# Patient Record
Sex: Female | Born: 1955 | Race: Black or African American | Hispanic: No | Marital: Single | Smoking: Current every day smoker
Health system: Southern US, Community
[De-identification: ages and names within clinical notes are randomized; demographics above are authoritative.]

## PROBLEM LIST (undated history)

## (undated) DIAGNOSIS — M1990 Unspecified osteoarthritis, unspecified site: Secondary | ICD-10-CM

## (undated) DIAGNOSIS — K219 Gastro-esophageal reflux disease without esophagitis: Secondary | ICD-10-CM

## (undated) DIAGNOSIS — F419 Anxiety disorder, unspecified: Secondary | ICD-10-CM

## (undated) DIAGNOSIS — G4730 Sleep apnea, unspecified: Secondary | ICD-10-CM

## (undated) DIAGNOSIS — F329 Major depressive disorder, single episode, unspecified: Secondary | ICD-10-CM

## (undated) DIAGNOSIS — E785 Hyperlipidemia, unspecified: Secondary | ICD-10-CM

## (undated) DIAGNOSIS — I10 Essential (primary) hypertension: Secondary | ICD-10-CM

## (undated) DIAGNOSIS — J45909 Unspecified asthma, uncomplicated: Secondary | ICD-10-CM

## (undated) DIAGNOSIS — F32A Depression, unspecified: Secondary | ICD-10-CM

## (undated) HISTORY — DX: Unspecified asthma, uncomplicated: J45.909

## (undated) HISTORY — DX: Depression, unspecified: F32.A

## (undated) HISTORY — DX: Sleep apnea, unspecified: G47.30

## (undated) HISTORY — PX: LEG SURGERY: SHX1003

## (undated) HISTORY — DX: Hyperlipidemia, unspecified: E78.5

## (undated) HISTORY — DX: Gastro-esophageal reflux disease without esophagitis: K21.9

## (undated) HISTORY — DX: Anxiety disorder, unspecified: F41.9

## (undated) HISTORY — DX: Unspecified osteoarthritis, unspecified site: M19.90

---
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
## 1998-10-30 ENCOUNTER — Emergency Department (HOSPITAL_COMMUNITY): Admission: EM | Admit: 1998-10-30 | Discharge: 1998-10-30 | Payer: Self-pay | Admitting: Emergency Medicine

## 1998-11-05 ENCOUNTER — Encounter: Payer: Self-pay | Admitting: Family Medicine

## 1998-11-05 ENCOUNTER — Ambulatory Visit (HOSPITAL_COMMUNITY): Admission: RE | Admit: 1998-11-05 | Discharge: 1998-11-05 | Payer: Self-pay | Admitting: Family Medicine

## 2000-05-22 ENCOUNTER — Encounter: Payer: Self-pay | Admitting: Emergency Medicine

## 2000-05-22 ENCOUNTER — Inpatient Hospital Stay (HOSPITAL_COMMUNITY): Admission: EM | Admit: 2000-05-22 | Discharge: 2000-05-23 | Payer: Self-pay | Admitting: Emergency Medicine

## 2001-07-25 ENCOUNTER — Encounter: Payer: Self-pay | Admitting: Family Medicine

## 2001-07-25 ENCOUNTER — Encounter: Admission: RE | Admit: 2001-07-25 | Discharge: 2001-07-25 | Payer: Self-pay | Admitting: Family Medicine

## 2001-09-18 ENCOUNTER — Encounter: Payer: Self-pay | Admitting: *Deleted

## 2001-09-18 ENCOUNTER — Encounter: Payer: Self-pay | Admitting: Orthopedic Surgery

## 2001-09-18 ENCOUNTER — Inpatient Hospital Stay (HOSPITAL_COMMUNITY): Admission: EM | Admit: 2001-09-18 | Discharge: 2001-09-20 | Payer: Self-pay | Admitting: *Deleted

## 2001-10-15 ENCOUNTER — Encounter: Admission: RE | Admit: 2001-10-15 | Discharge: 2002-01-13 | Payer: Self-pay | Admitting: Orthopedic Surgery

## 2012-10-11 ENCOUNTER — Encounter (HOSPITAL_COMMUNITY): Payer: Self-pay | Admitting: Emergency Medicine

## 2012-10-11 ENCOUNTER — Emergency Department (INDEPENDENT_AMBULATORY_CARE_PROVIDER_SITE_OTHER)
Admission: EM | Admit: 2012-10-11 | Discharge: 2012-10-11 | Disposition: A | Discharge status: Home / Self Care | Payer: 59 | Source: Home / Self Care | Attending: Emergency Medicine | Admitting: Emergency Medicine

## 2012-10-11 LAB — CBC WITH DIFFERENTIAL/PLATELET
Basophils Absolute: 0 10*3/uL (ref 0.0–0.1)
Basophils Relative: 0 % (ref 0–1)
Eosinophils Absolute: 0.1 10*3/uL (ref 0.0–0.7)
Eosinophils Relative: 1 % (ref 0–5)
Lymphs Abs: 3 10*3/uL (ref 0.7–4.0)
MCH: 34 pg (ref 26.0–34.0)
MCHC: 34.9 g/dL (ref 30.0–36.0)
MCV: 97.3 fL (ref 78.0–100.0)
Neutrophils Relative %: 54 % (ref 43–77)
Platelets: 226 10*3/uL (ref 150–400)
RBC: 4.06 MIL/uL (ref 3.87–5.11)
RDW: 13.6 % (ref 11.5–15.5)

## 2012-10-11 LAB — SEDIMENTATION RATE: Sed Rate: 38 mm/hr — ABNORMAL HIGH (ref 0–22)
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
## 2012-10-11 NOTE — ED Provider Notes (Signed)
Medical screening examination/treatment/procedure(s) were performed by non-physician practitioner and as supervising physician I was immediately available for consultation/collaboration.  David Keller, M.D.  David C Keller, MD 10/11/12 2335 
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
## 2012-10-14 NOTE — ED Notes (Signed)
Sed rate 38 H.  Lab shown to Rica Mast NP.  She said pt. was referred to Dr. Merlyn Lot and he had requested it. No further action. Vassie Moselle 10/14/2012

## 2015-05-31 ENCOUNTER — Encounter (HOSPITAL_COMMUNITY): Payer: Self-pay | Admitting: Emergency Medicine

## 2015-05-31 ENCOUNTER — Emergency Department (HOSPITAL_COMMUNITY): Payer: Worker's Compensation

## 2015-05-31 ENCOUNTER — Emergency Department (HOSPITAL_COMMUNITY)
Admission: EM | Admit: 2015-05-31 | Discharge: 2015-05-31 | Disposition: A | Discharge status: Home / Self Care | Payer: Worker's Compensation | Attending: Emergency Medicine | Admitting: Emergency Medicine

## 2015-05-31 DIAGNOSIS — S81812A Laceration without foreign body, left lower leg, initial encounter: Secondary | ICD-10-CM | POA: Diagnosis not present

## 2015-05-31 DIAGNOSIS — Y9289 Other specified places as the place of occurrence of the external cause: Secondary | ICD-10-CM | POA: Diagnosis not present

## 2015-05-31 DIAGNOSIS — W25XXXA Contact with sharp glass, initial encounter: Secondary | ICD-10-CM | POA: Insufficient documentation

## 2015-05-31 DIAGNOSIS — Z720 Tobacco use: Secondary | ICD-10-CM | POA: Insufficient documentation

## 2015-05-31 DIAGNOSIS — Y998 Other external cause status: Secondary | ICD-10-CM | POA: Diagnosis not present

## 2015-05-31 DIAGNOSIS — S8992XA Unspecified injury of left lower leg, initial encounter: Secondary | ICD-10-CM | POA: Diagnosis present

## 2015-05-31 DIAGNOSIS — Y9389 Activity, other specified: Secondary | ICD-10-CM | POA: Insufficient documentation

## 2015-05-31 IMAGING — CR DG TIBIA/FIBULA 2V*L*
1 series · 2 of 2 positions shown · non-contrast
Comparison: None.

CLINICAL DATA: Laceration to the left mid tibia medially. Clinical
concern for glass foreign body.

EXAM:
LEFT TIBIA AND FIBULA - 2 VIEW

[Series 1: AP · left · 2 of 2 slices shown]
[im 1/2]
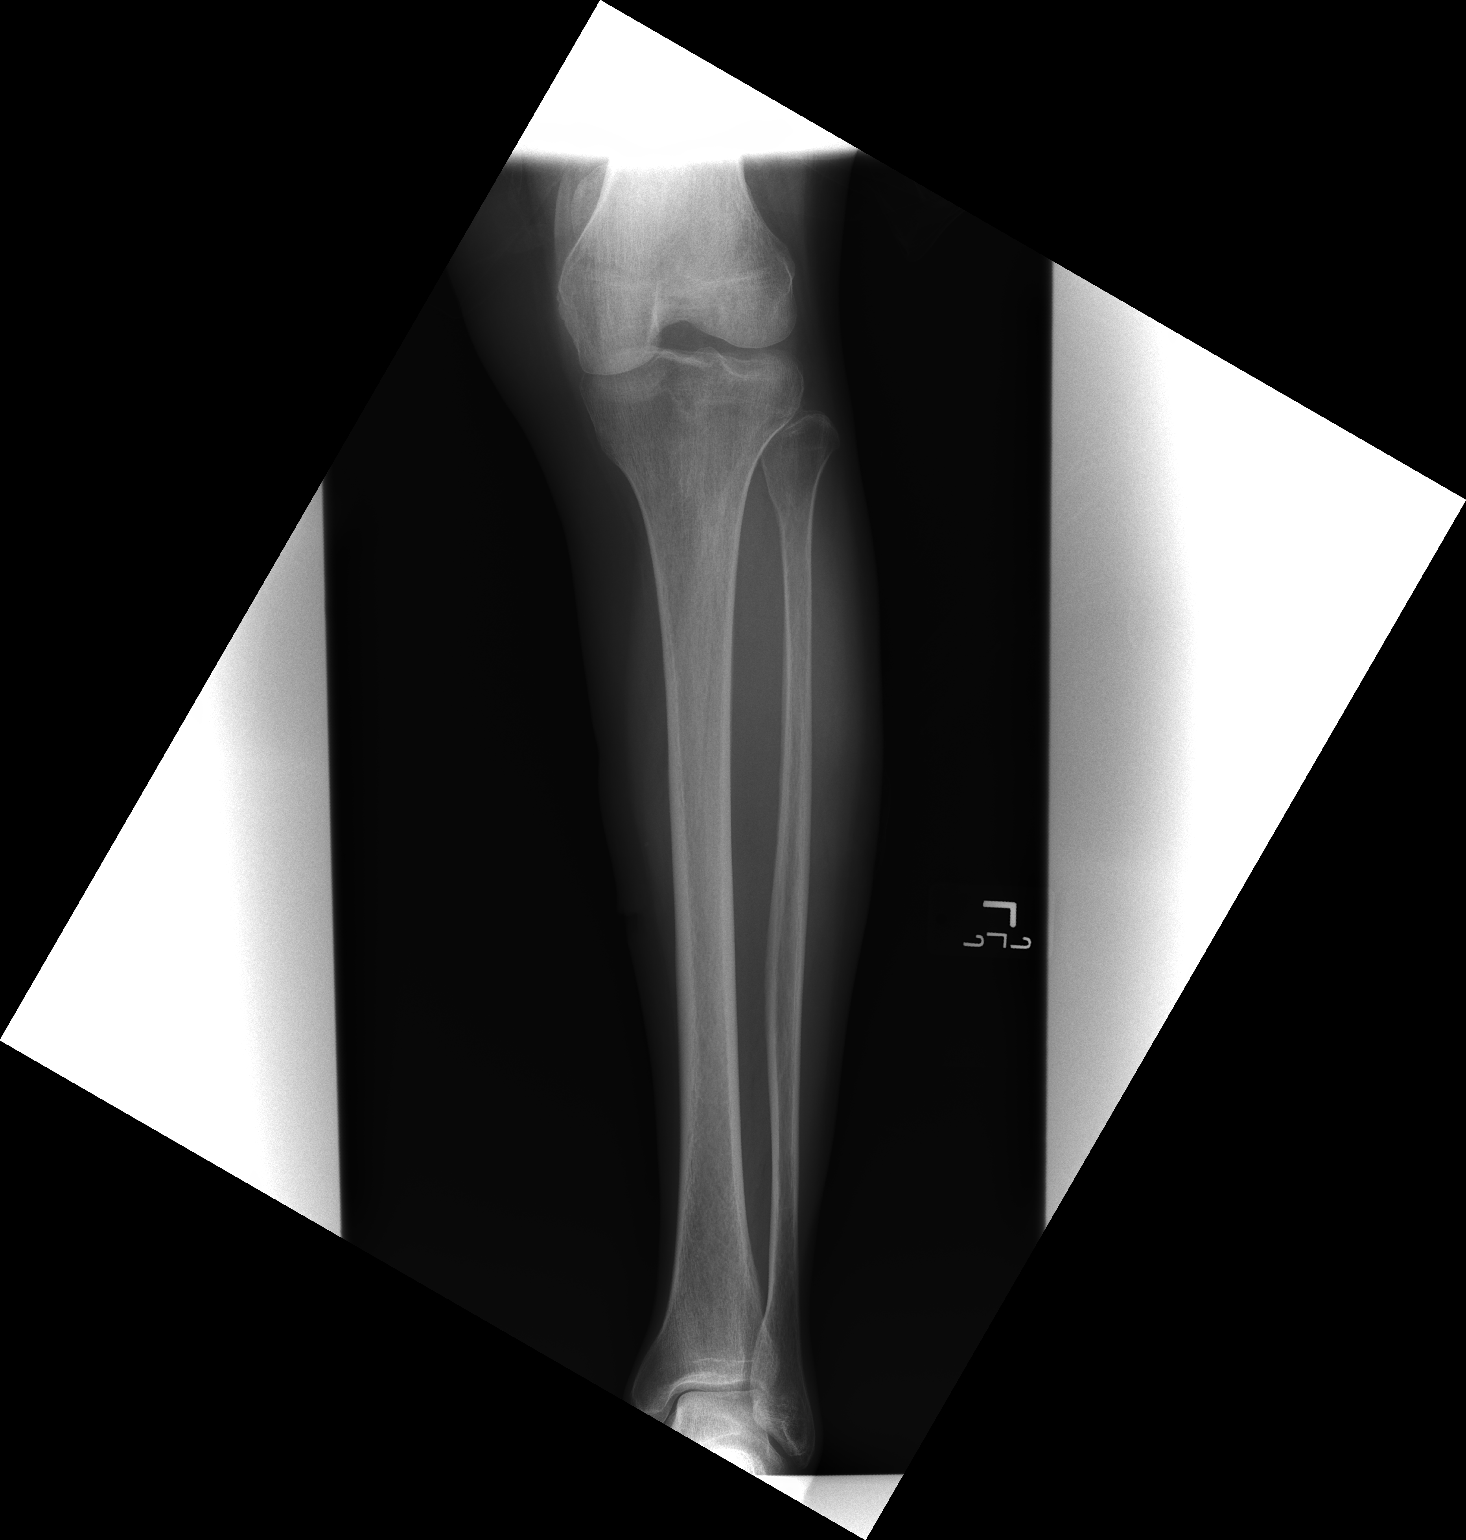
[im 2/2]
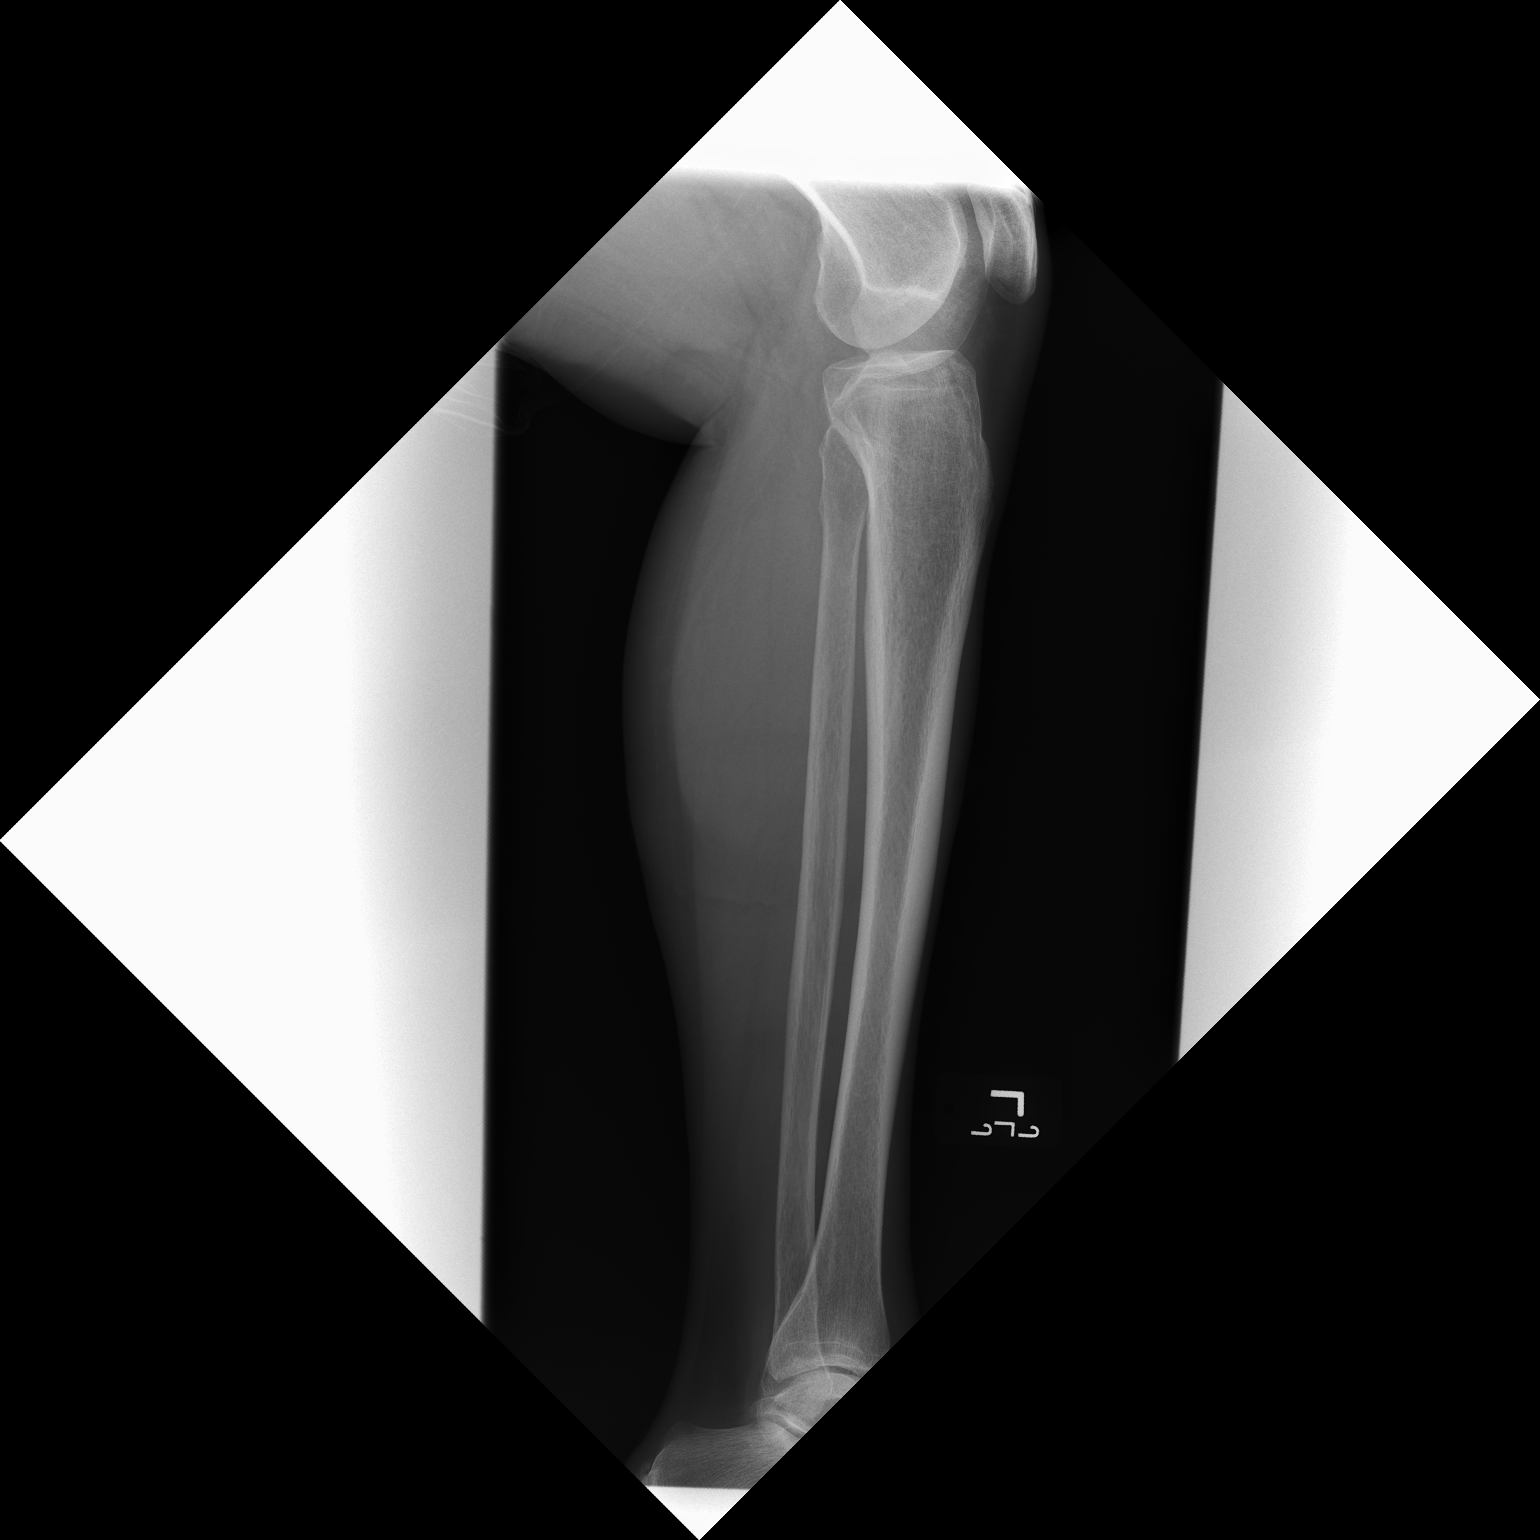

[2 of 2 positions shown; findings below may reference images not displayed]

FINDINGS: No fracture or suspicious focal osseous lesion. Soft tissues
laceration is noted in the medial left lower extremity soft tissues
at the level of the mid tibial shaft. No radiopaque foreign body is
seen immediately adjacent to the soft tissue laceration.
Approximately 2.7 cm superior to the soft tissue laceration, there
is a nonspecific 2 mm round soft tissue density.
IMPRESSION: Soft tissue laceration in the medial left lower extremity soft
tissues at the level of the mid tibial shaft. No radiopaque foreign
body immediately adjacent to the soft tissue laceration. Nonspecific
2 mm round soft tissue density is seen approximately 2.7 cm superior
to the soft tissue laceration, which likely represents a calcified
venous phlebolith, less likely a radiopaque foreign body.

## 2015-05-31 MED ORDER — BACITRACIN ZINC 500 UNIT/GM EX OINT
TOPICAL_OINTMENT | CUTANEOUS | Status: AC
  Filled 2015-05-31: qty 0.9

## 2015-05-31 MED ORDER — ACETAMINOPHEN 500 MG PO TABS
1000.0000 mg | ORAL_TABLET | Freq: Once | ORAL | Status: AC
  Administered 2015-05-31: 1000 mg via ORAL
  Filled 2015-05-31: qty 2

## 2015-05-31 MED ORDER — LIDOCAINE-EPINEPHRINE (PF) 2 %-1:200000 IJ SOLN
20.0000 mL | Freq: Once | INTRAMUSCULAR | Status: DC
  Filled 2015-05-31: qty 20
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
## 2015-05-31 NOTE — ED Notes (Signed)
Bed: WA17 Expected date:  Expected time:  Means of arrival:  Comments: EMS 62f lac to leg

## 2015-05-31 NOTE — ED Notes (Signed)
Laceration wrapped with 4x4 gauze for pt. To use restroom.

## 2015-05-31 NOTE — ED Notes (Signed)
Pt c/o laceration of left calf area. Pt denies any lightheadedness 0 pain, dizziness, and  Nausea.  Was freighted by all the blood loss. States her leg does sting though.

## 2015-05-31 NOTE — ED Notes (Signed)
Lidocaine and cart waiting for provider

## 2016-09-04 ENCOUNTER — Other Ambulatory Visit: Payer: Self-pay | Admitting: Internal Medicine

## 2016-09-04 DIAGNOSIS — E2839 Other primary ovarian failure: Secondary | ICD-10-CM

## 2017-04-06 ENCOUNTER — Encounter (HOSPITAL_COMMUNITY): Payer: Self-pay | Admitting: *Deleted

## 2017-04-06 ENCOUNTER — Emergency Department (HOSPITAL_COMMUNITY)
Admission: EM | Admit: 2017-04-06 | Discharge: 2017-04-06 | Disposition: A | Discharge status: Home / Self Care | Payer: Medicaid Other | Attending: Emergency Medicine | Admitting: Emergency Medicine

## 2017-04-06 DIAGNOSIS — F17210 Nicotine dependence, cigarettes, uncomplicated: Secondary | ICD-10-CM | POA: Diagnosis not present

## 2017-04-06 DIAGNOSIS — I10 Essential (primary) hypertension: Secondary | ICD-10-CM | POA: Diagnosis not present

## 2017-04-06 DIAGNOSIS — K047 Periapical abscess without sinus: Secondary | ICD-10-CM | POA: Diagnosis not present

## 2017-04-06 DIAGNOSIS — K0889 Other specified disorders of teeth and supporting structures: Secondary | ICD-10-CM | POA: Diagnosis present

## 2017-04-06 HISTORY — DX: Essential (primary) hypertension: I10

## 2017-04-06 MED ORDER — LIDOCAINE VISCOUS 2 % MT SOLN
15.0000 mL | Freq: Once | OROMUCOSAL | Status: AC
  Administered 2017-04-06: 15 mL via OROMUCOSAL
  Filled 2017-04-06: qty 15

## 2017-04-06 MED ORDER — BUPIVACAINE-EPINEPHRINE (PF) 0.5% -1:200000 IJ SOLN
1.8000 mL | Freq: Once | INTRAMUSCULAR | Status: AC
  Administered 2017-04-06: 1.8 mL
  Filled 2017-04-06: qty 1.8

## 2017-04-06 MED ORDER — AMOXICILLIN-POT CLAVULANATE 875-125 MG PO TABS: 1.0000 | ORAL_TABLET | Freq: Two times a day (BID) | ORAL | 0 refills | Status: AC

## 2017-04-06 MED ORDER — AMOXICILLIN-POT CLAVULANATE 875-125 MG PO TABS
1.0000 | ORAL_TABLET | Freq: Once | ORAL | Status: AC
  Administered 2017-04-06: 1 via ORAL
  Filled 2017-04-06: qty 1

## 2017-04-06 NOTE — Care Management (Signed)
ED CM was consulted by Gwenith Spitz in [facility] concerning medication assistance need. CM reviewed record, no health insurance listed. Pt is eligible for MATCH. Discussed MATCH program and the guidelines including the  $3 co-pay per prescription. Pt verbalized understanding and is agreeable with accepting the assistance.  Pt enrolled and [place] letter printed and given to patient with list of participating pharmacies.  Patient was instructed to present  prescriptions with [place] letter to a participating Pharmacies from the list provided. Patient verbalizes  understanding and appreciation. Reminded patient of the importance of following up with PCP. Pt verbalized understanding teach back done. No further ED CM needs identified.

## 2017-04-06 NOTE — Discharge Instructions (Signed)
Please take Augmentin once every 12 hours for the next 5 days. Taking this medication with food can help decrease the chances of getting an upset stomach.  Please call your dentist's office on Monday morning to schedule a follow-up appointment.  Please swish and spit with warm salt water 2-3 times a day over the next few days..  If he develop new or worsening symptoms, including thick drainage from the area that looks like pus, fever, or chills, please return to the emergency department for reevaluation.

## 2017-04-06 NOTE — ED Provider Notes (Signed)
[facility] DEPT Provider Note   CSN: [id number] Arrival date & time: 04/06/17  1220     History   Chief Complaint No chief complaint on file.   HPI Hannah Moon is a 61 y.o. female who presents to the emergency department with a chief complaint of left lower mouth pain and purulent discharge that began this morning. No fever or chills. No treatment prior to arrival. She denies trismus, drooling, neck pain, or dental pain. She reports that she is established with a dentist, but his office was closed today.   The history is provided by the patient. No language interpreter was used.    Past Medical History:  Diagnosis Date  . Hypertension     There are no active problems to display for this patient.   Past Surgical History:  Procedure Laterality Date  . LEG SURGERY      OB History    No data available       Home Medications    Prior to Admission medications   Medication Sig Start Date End Date Taking? Authorizing Provider  amoxicillin-clavulanate (AUGMENTIN) 875-125 MG tablet Take 1 tablet by mouth every 12 (twelve) hours. 04/06/17 04/11/17  Roverto Bodmer, Maree Erie A, PA-C    Family History No family history on file.  Social History Social History  Substance Use Topics  . Smoking status: Current Every Day Smoker    Packs/day: 0.50    Years: 40.00    Types: Cigarettes  . Smokeless tobacco: Never Used  . Alcohol use 0.6 oz/week    1 Cans of beer per week     Allergies   Patient has no known allergies.   Review of Systems Review of Systems  Constitutional: Negative for chills and fever.  HENT: Positive for dental problem. Negative for trouble swallowing and voice change.   Skin: Positive for wound.     Physical Exam Updated Vital Signs BP (!) 140/95 (BP Location: Left Arm)   Pulse 79   Temp 98.7 F (37.1 C) (Oral)   Resp 17   SpO2 97%   Physical Exam  Constitutional: No distress.  HENT:  Head: Normocephalic.  There is a 0.5 mm fluctuant abscess  to the left anterolateral mastication space. No surrounding cellulitis.   Eyes: Conjunctivae are normal.  Neck: Normal range of motion. Neck supple.  The neck is nontender to palpation.  Cardiovascular: Normal rate and regular rhythm.  Exam reveals no gallop and no friction rub.   No murmur heard. Pulmonary/Chest: Effort normal. No respiratory distress.  Abdominal: Soft. She exhibits no distension.  Neurological: She is alert.  Skin: Skin is warm. No rash noted.  Psychiatric: Her behavior is normal.  Nursing note and vitals reviewed.  ED Treatments / Results  Labs (all labs ordered are listed, but only abnormal results are displayed) Labs Reviewed - No data to display  EKG  EKG Interpretation None       Radiology No results found.  Procedures .Marland KitchenIncision and Drainage Date/Time: 04/06/2017 4:37 PM Performed by: Sherryle Lis, [place] Masaki A Authorized by: Joline Maxcy A   Consent:    Consent obtained:  Verbal   Consent given by:  Patient   Risks discussed:  Bleeding, incomplete drainage, pain and infection   Alternatives discussed:  No treatment Location:    Type:  Abscess   Size:  0.5 cm   Location:  Mouth   Mouth location:  Masticator space Anesthesia (see MAR for exact dosages):    Anesthesia method:  Local infiltration and topical  application   Topical anesthetic:  Lidocaine gel   Local anesthetic:  Bupivacaine 0.5% WITH epi Procedure details:    Incision types:  Single straight   Incision depth:  Dermal   Scalpel blade:  10   Wound management:  Irrigated with saline   Drainage:  Bloody and purulent   Drainage amount:  Moderate   Wound treatment:  Wound left open   Packing materials:  None Post-procedure details:    Patient tolerance of procedure:  Tolerated well, no immediate complications   (including critical care time)  Medications Ordered in ED Medications  amoxicillin-clavulanate (AUGMENTIN) 875-125 MG per tablet 1 tablet (not administered)  lidocaine  (XYLOCAINE) 2 % viscous mouth solution 15 mL (15 mLs Mouth/Throat Given 04/06/17 1621)  bupivacaine-epinephrine (MARCAINE W/ EPI) 0.5% -1:200000 injection 1.8 mL (1.8 mLs Infiltration Given by Other 04/06/17 1623)     Initial Impression / Assessment and Plan / ED Course  I have reviewed the triage vital signs and the nursing notes.  Pertinent labs & imaging results that were available during my care of the patient were reviewed by me and considered in my medical decision making (see chart for details).     Patient with dental abscess amenable to incision and drainage.  Abscess was not large enough to warrant packing or drain,  wound recheck in 3 days with dentist. Encouraged home warm soaks and flushing. No evidence of surrounding cellulitis.  Strict return precautions given. Will d/c to home with Augmentin.   Final Clinical Impressions(s) / ED Diagnoses   Final diagnoses:  Dental abscess    New Prescriptions New Prescriptions   AMOXICILLIN-CLAVULANATE (AUGMENTIN) 875-125 MG TABLET    Take 1 tablet by mouth every 12 (twelve) hours.     Joanne Gavel, PA-C 04/06/17 1645    Dorie Rank, MD 04/06/17 [phone]

## 2017-04-06 NOTE — ED Notes (Signed)
Spoke with case management.

## 2017-04-06 NOTE — ED Triage Notes (Signed)
Pt in c/o L lower gum swelling and discharge onset this am, pt A&O x4

## 2017-04-19 ENCOUNTER — Emergency Department (HOSPITAL_COMMUNITY)
Admission: EM | Admit: 2017-04-19 | Discharge: 2017-04-19 | Disposition: A | Discharge status: Home / Self Care | Payer: Medicaid Other | Attending: Emergency Medicine | Admitting: Emergency Medicine

## 2017-04-19 DIAGNOSIS — F17210 Nicotine dependence, cigarettes, uncomplicated: Secondary | ICD-10-CM | POA: Diagnosis not present

## 2017-04-19 DIAGNOSIS — Y999 Unspecified external cause status: Secondary | ICD-10-CM | POA: Diagnosis not present

## 2017-04-19 DIAGNOSIS — Y929 Unspecified place or not applicable: Secondary | ICD-10-CM | POA: Insufficient documentation

## 2017-04-19 DIAGNOSIS — M79604 Pain in right leg: Secondary | ICD-10-CM | POA: Diagnosis present

## 2017-04-19 DIAGNOSIS — Y939 Activity, unspecified: Secondary | ICD-10-CM | POA: Diagnosis not present

## 2017-04-19 DIAGNOSIS — W57XXXA Bitten or stung by nonvenomous insect and other nonvenomous arthropods, initial encounter: Secondary | ICD-10-CM | POA: Diagnosis not present

## 2017-04-19 DIAGNOSIS — I10 Essential (primary) hypertension: Secondary | ICD-10-CM | POA: Insufficient documentation

## 2017-04-19 DIAGNOSIS — T148XXA Other injury of unspecified body region, initial encounter: Secondary | ICD-10-CM | POA: Diagnosis not present

## 2017-04-19 MED ORDER — DOXYCYCLINE HYCLATE 100 MG PO CAPS: 100.0000 mg | ORAL_CAPSULE | Freq: Two times a day (BID) | ORAL | 0 refills | Status: DC

## 2017-04-19 NOTE — ED Triage Notes (Signed)
Pt has a rob in her right lower leg and noticed a knot that has came up along her right shin that is sometimes mildly painful with walking, no pain at this this, no redness or swelling noted other than  A Small knot of right shin.

## 2017-04-19 NOTE — ED Provider Notes (Signed)
[facility] DEPT Provider Note   CSN: [id number] Arrival date & time: 04/19/17  0847     History   Chief Complaint Chief Complaint  Patient presents with  . Leg Pain    HPI Hannah Moon is a 61 y.o. female who presents emergency Department with chief complaint of right leg pain. Patient states that she got a mosquito bite on the outside of her leg and began scratching it. The next day she noticed swelling that was painful and tender to palpation on the right outer part of her leg. She has a history of right tib-fib ORIF and was concerned that her swelling might be secondary to her previous surgery 3 or 4 years ago. She denies any global swelling, calf pain, chest pain or shortness of breath. Her swelling is localized to the area under her mosquito bite. She denies any fevers, chills or or other symptoms suggestive of systemic infection. She rates her pain at 2 out of 10. It does not radiate.  HPI  Past Medical History:  Diagnosis Date  . Hypertension     There are no active problems to display for this patient.   Past Surgical History:  Procedure Laterality Date  . LEG SURGERY      OB History    No data available       Home Medications    Prior to Admission medications   Medication Sig Start Date End Date Taking? Authorizing Provider  doxycycline (VIBRAMYCIN) 100 MG capsule Take 1 capsule (100 mg total) by mouth 2 (two) times daily. 04/19/17   Margarita Mail, PA-C    Family History No family history on file.  Social History Social History  Substance Use Topics  . Smoking status: Current Every Day Smoker    Packs/day: 0.50    Years: 40.00    Types: Cigarettes  . Smokeless tobacco: Never Used  . Alcohol use 0.6 oz/week    1 Cans of beer per week     Allergies   Patient has no known allergies.   Review of Systems Review of Systems  Ten systems reviewed and are negative for acute change, except as noted in the HPI.   Physical Exam Updated Vital  Signs BP 138/83 (BP Location: Right Arm)   Pulse 78   Temp 98 F (36.7 C) (Oral)   Resp 16   Ht 5\' 1"  (1.549 m)   Wt 71.2 kg (157 lb)   SpO2 99%   BMI 29.66 kg/m   Physical Exam  Constitutional: She is oriented to person, place, and time. She appears well-developed and well-nourished. No distress.  HENT:  Head: Normocephalic and atraumatic.  Eyes: Conjunctivae are normal. No scleral icterus.  Neck: Normal range of motion.  Cardiovascular: Normal rate, regular rhythm and normal heart sounds.  Exam reveals no gallop and no friction rub.   No murmur heard. Pulmonary/Chest: Effort normal and breath sounds normal. No respiratory distress.  Abdominal: Soft. Bowel sounds are normal. She exhibits no distension and no mass. There is no tenderness. There is no guarding.  Musculoskeletal:  6 cm area of tenderness and swelling over the right lateral lower leg. There is central duration and excoriation. Tender to palpation without warmth, erythema. It is well circumscribed, mild bruising underneath the tissue.  Neurological: She is alert and oriented to person, place, and time.  Skin: Skin is warm and dry. She is not diaphoretic.  Psychiatric: Her behavior is normal.  Nursing note and vitals reviewed.    ED Treatments /  Results  Labs (all labs ordered are listed, but only abnormal results are displayed) Labs Reviewed - No data to display  EKG  EKG Interpretation None       Radiology No results found.  Procedures Procedures (including critical care time)  Medications Ordered in ED Medications - No data to display   Initial Impression / Assessment and Plan / ED Course  I have reviewed the triage vital signs and the nursing notes.  Pertinent labs & imaging results that were available during my care of the patient were reviewed by me and considered in my medical decision making (see chart for details).    Patient with what appears to be hematoma of the right lower leg. I  believe is secondary to the excoriation of the tissue. She is not on any blood thinners. There is no evidence of DVT, no generalized swelling. Differential includes developing abscess. The patient has recently completed a course of antibiotics for a dental abscess. I am giving her a prescription for doxycycline to hold in case of developing infection. We have discussed signs and symptoms. Patient to follow up with PCP. Discussed return precautions. She appears safe for discharge at this time  Final Clinical Impressions(s) / ED Diagnoses   Final diagnoses:  Hematoma  Insect bite, initial encounter    New Prescriptions New Prescriptions   DOXYCYCLINE (VIBRAMYCIN) 100 MG CAPSULE    Take 1 capsule (100 mg total) by mouth 2 (two) times daily.     Margarita Mail, PA-C 04/19/17 [place], MD 04/19/17 1728

## 2017-04-19 NOTE — ED Notes (Signed)
Pt c/o pain and swelling to right lower leg. States has hx of open tib/fib and "that was where the bone came out".

## 2017-04-19 NOTE — Discharge Instructions (Signed)
Follow these instructions at home: Apply ice to the injured area: Put ice in a plastic bag. Place a towel between your skin and the bag. Leave the ice on for 20 minutes, 2-3 times a day for the first 1 to 2 days. After the first 2 days, switch to using warm compresses on the hematoma. Elevate the injured area to help decrease pain and swelling. Wrapping the area with an elastic bandage may also be helpful. Compression helps to reduce swelling and promotes shrinking of the hematoma. Make sure the bandage is not wrapped too tight. If your hematoma is on a lower extremity and is painful, crutches may be helpful for a couple days. Only take over-the-counter or prescription medicines as directed by your health care provider. Get help right away if: You have increasing pain, or your pain is not controlled with medicine. You have a fever. You have worsening swelling or discoloration. Your skin over the hematoma breaks or starts bleeding. Your hematoma is in your chest or abdomen and you have weakness, shortness of breath, or a change in consciousness. Your hematoma is on your scalp (caused by a fall or injury) and you have a worsening headache or a change in alertness or consciousness.

## 2018-03-03 ENCOUNTER — Other Ambulatory Visit: Payer: Self-pay

## 2018-03-03 ENCOUNTER — Emergency Department (HOSPITAL_COMMUNITY): Payer: Self-pay

## 2018-03-03 ENCOUNTER — Emergency Department (HOSPITAL_COMMUNITY)
Admission: EM | Admit: 2018-03-03 | Discharge: 2018-03-03 | Disposition: A | Discharge status: Home / Self Care | Payer: Self-pay | Attending: Emergency Medicine | Admitting: Emergency Medicine

## 2018-03-03 DIAGNOSIS — F17210 Nicotine dependence, cigarettes, uncomplicated: Secondary | ICD-10-CM | POA: Insufficient documentation

## 2018-03-03 DIAGNOSIS — S8012XA Contusion of left lower leg, initial encounter: Secondary | ICD-10-CM | POA: Insufficient documentation

## 2018-03-03 DIAGNOSIS — S9032XA Contusion of left foot, initial encounter: Secondary | ICD-10-CM | POA: Insufficient documentation

## 2018-03-03 DIAGNOSIS — W182XXA Fall in (into) shower or empty bathtub, initial encounter: Secondary | ICD-10-CM | POA: Insufficient documentation

## 2018-03-03 DIAGNOSIS — S93402A Sprain of unspecified ligament of left ankle, initial encounter: Secondary | ICD-10-CM | POA: Insufficient documentation

## 2018-03-03 DIAGNOSIS — Y999 Unspecified external cause status: Secondary | ICD-10-CM | POA: Insufficient documentation

## 2018-03-03 DIAGNOSIS — I10 Essential (primary) hypertension: Secondary | ICD-10-CM | POA: Insufficient documentation

## 2018-03-03 DIAGNOSIS — Y92002 Bathroom of unspecified non-institutional (private) residence single-family (private) house as the place of occurrence of the external cause: Secondary | ICD-10-CM | POA: Insufficient documentation

## 2018-03-03 DIAGNOSIS — Y93E1 Activity, personal bathing and showering: Secondary | ICD-10-CM | POA: Insufficient documentation

## 2018-03-03 IMAGING — DX DG FOOT COMPLETE 3+V*L*
3 series · 3 of 3 positions shown · non-contrast
Comparison: None.

CLINICAL DATA: Lateral ankle pain and swelling after falling from
bathtub yesterday. Initial encounter.

EXAM:
LEFT FOOT - COMPLETE 3+ VIEW

[x foot lat left]
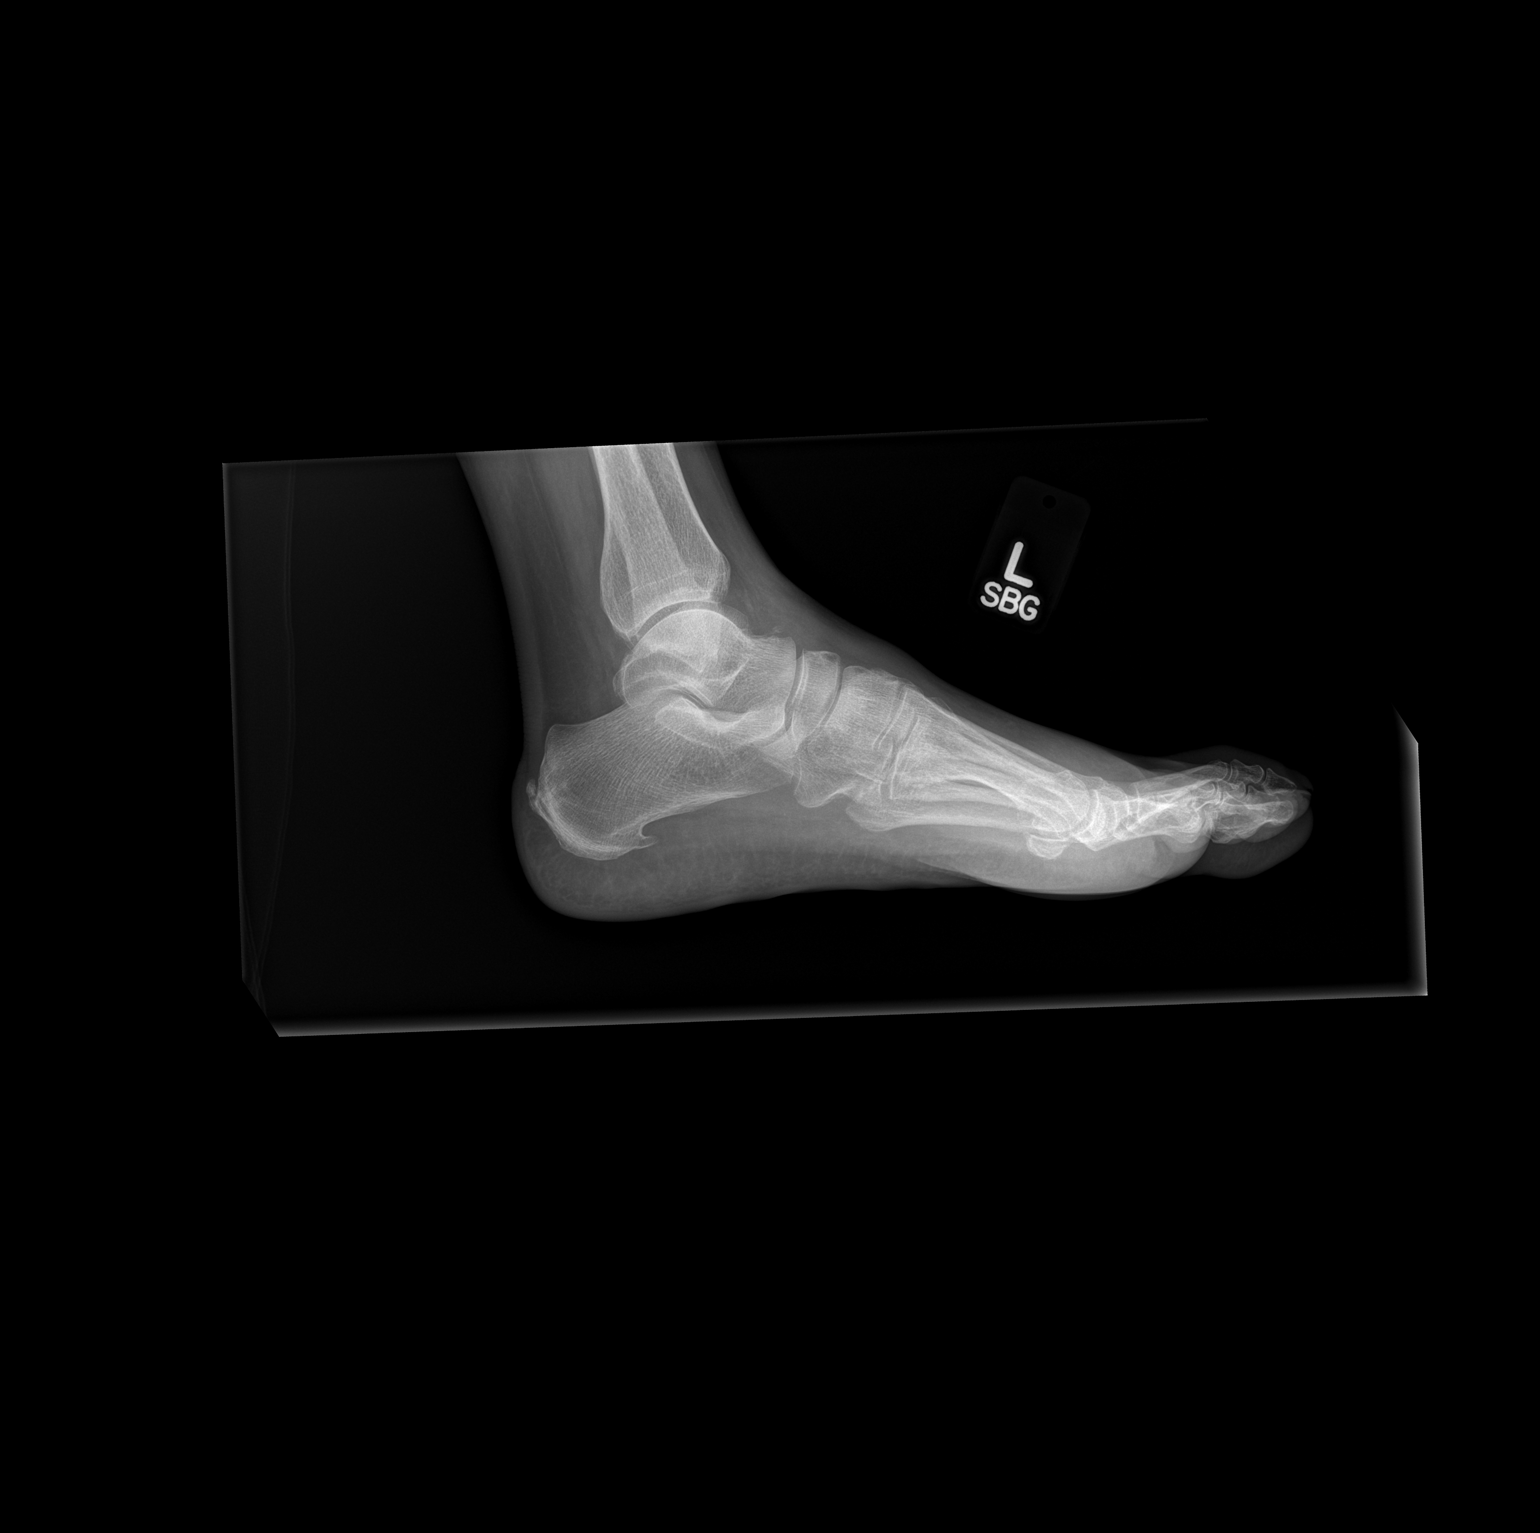

[x foot ap left]
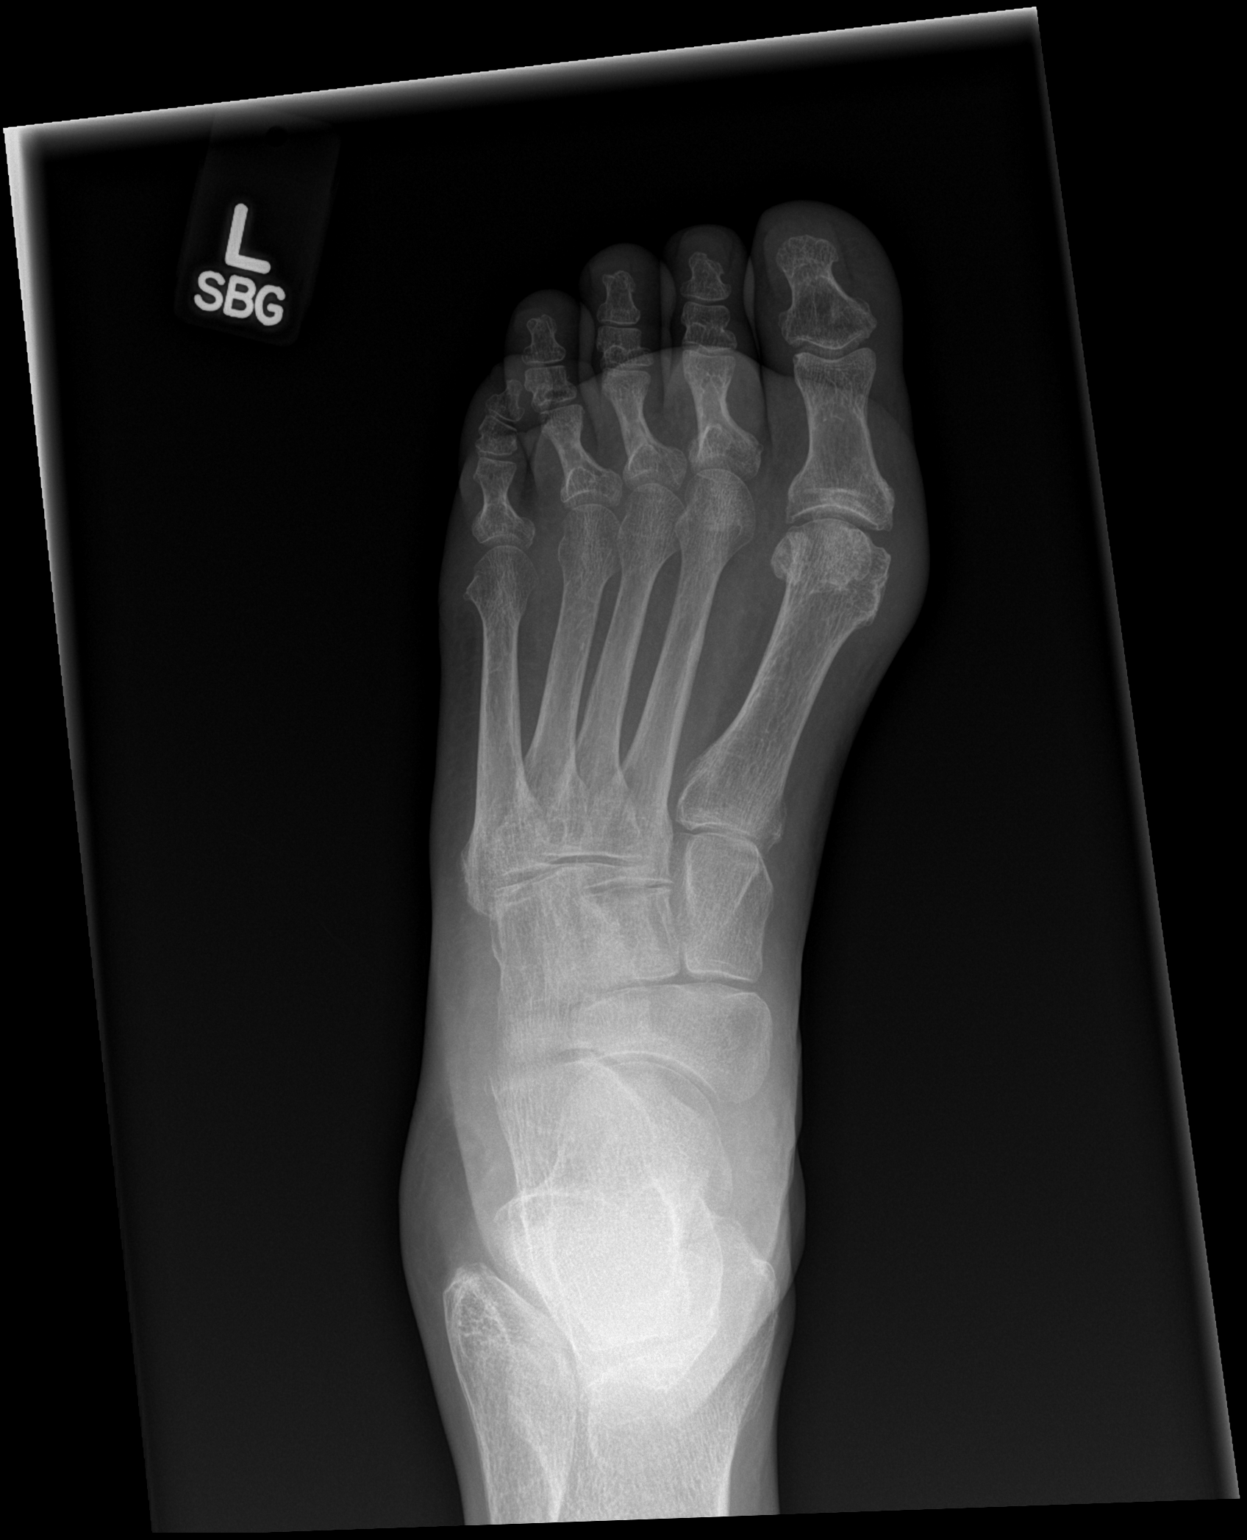

[x foot obl left]
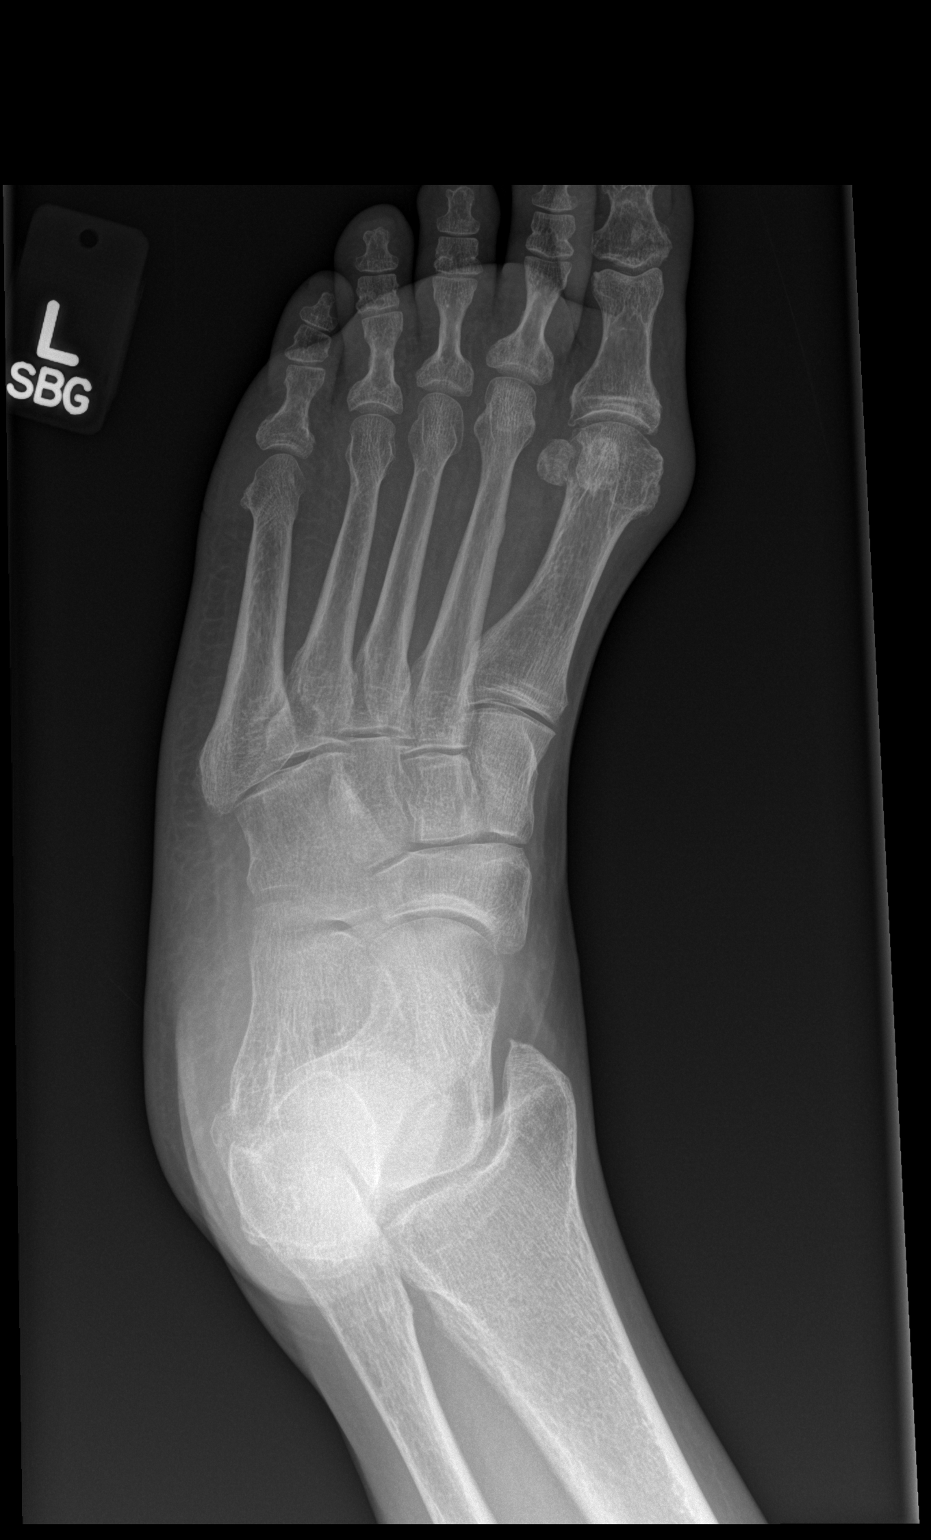

[3 of 3 positions shown; findings below may reference images not displayed]

FINDINGS: The bones appear demineralized. There is a possible small avulsion
fracture laterally from the anterior process of the calcaneus with
adjacent soft tissue swelling. No other evidence of acute fracture
or dislocation in the foot. Mild degenerative changes are present at
the 1st metatarsophalangeal joint.
IMPRESSION: Possible small avulsion fracture laterally from the anterior process
of the calcaneus.

## 2018-03-03 IMAGING — DX DG KNEE COMPLETE 4+V*L*
4 series · 4 of 4 positions shown · non-contrast
Comparison: Lower leg radiographs 05/31/2015.

CLINICAL DATA: Fall from bathtub yesterday. Pain from left hip to
left foot.

EXAM:
LEFT KNEE - COMPLETE 4+ VIEW

[x knee ap left]
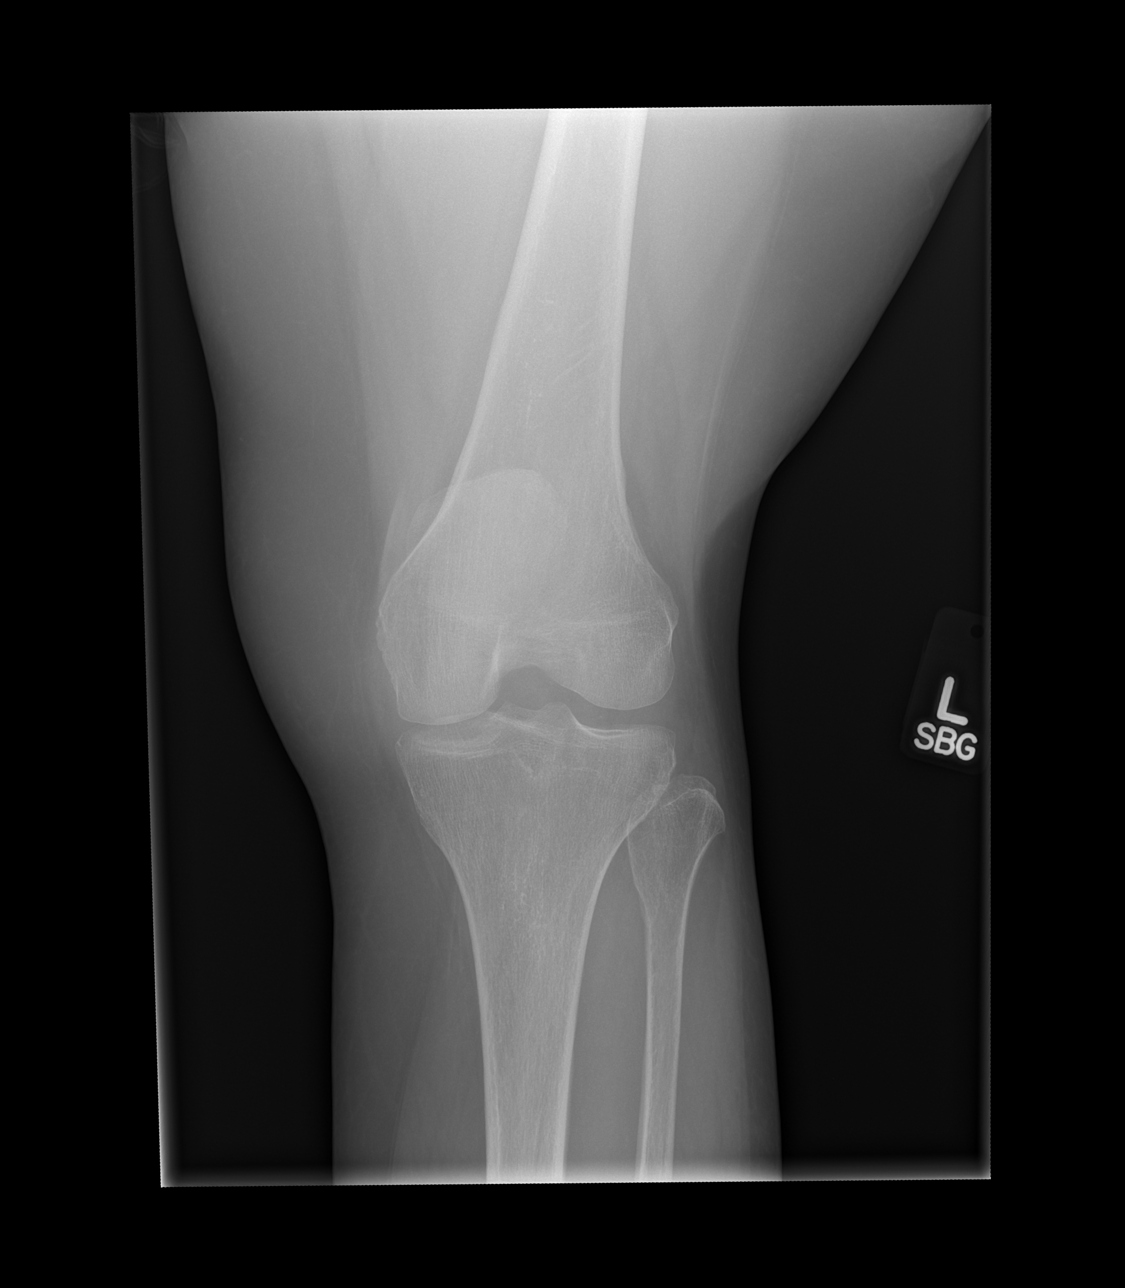

[x knee obl left (1 of 2)]
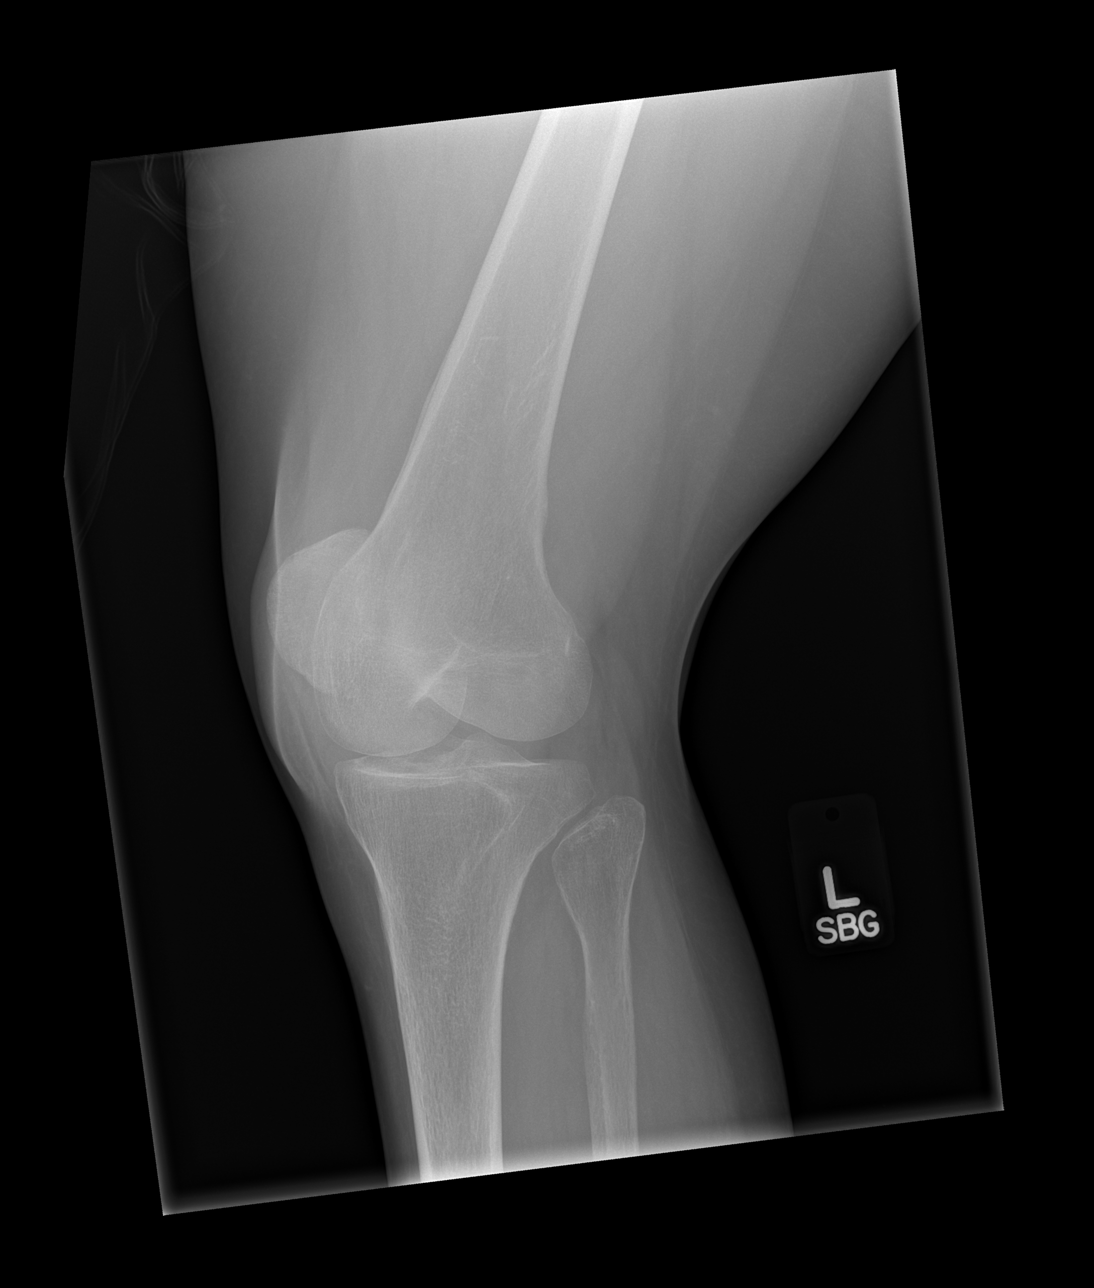

[x knee obl left (2 of 2)]
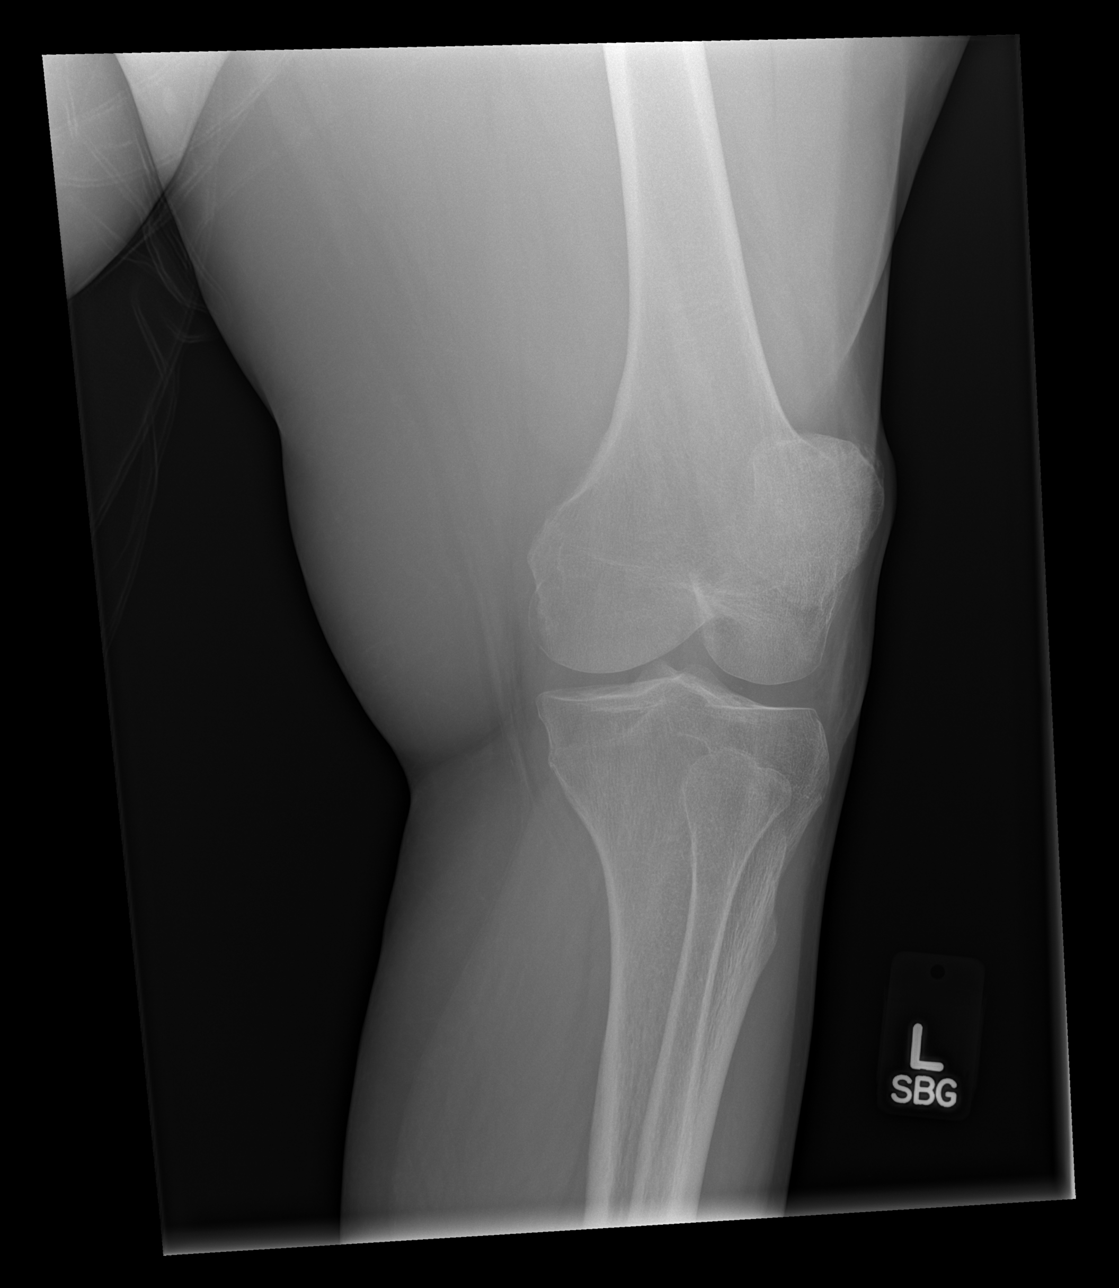

[x knee lat left]
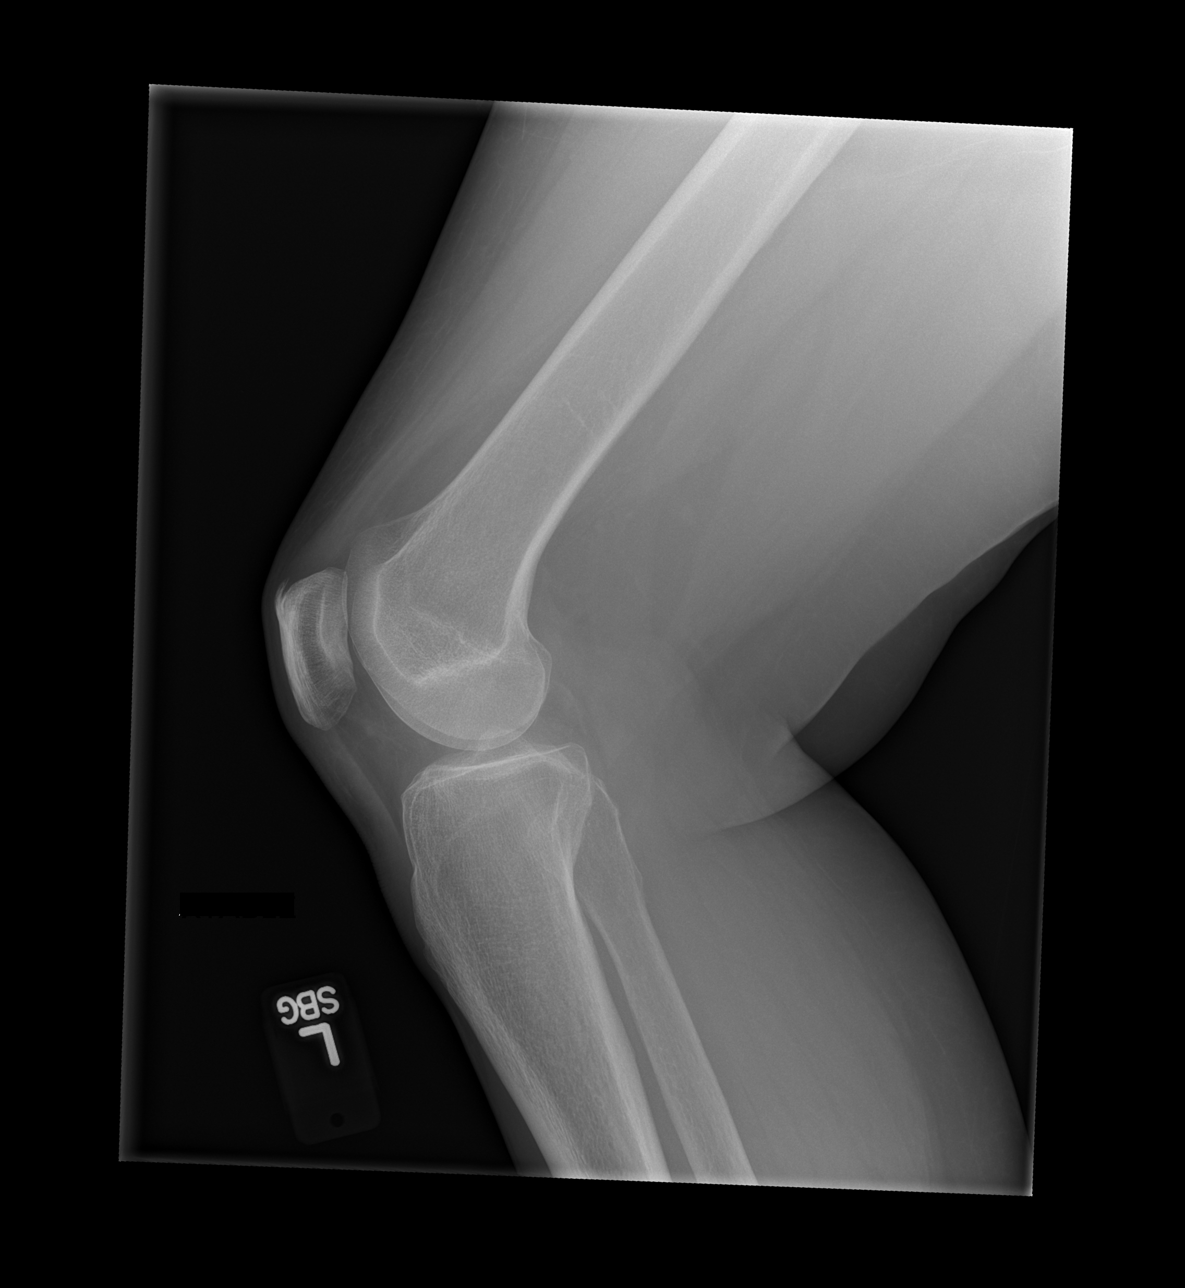

[4 of 4 positions shown; findings below may reference images not displayed]

FINDINGS: The bones appear mildly demineralized. No evidence of acute fracture
or dislocation. The joint spaces appear adequately maintained. There
is no joint effusion. There is spurring at the quadriceps insertion
on the patella.
IMPRESSION: No acute osseous findings.

## 2018-03-03 IMAGING — DX DG ANKLE COMPLETE 3+V*L*
4 series · 4 of 4 positions shown · non-contrast
Comparison: Lower leg radiographs 05/31/2015.

CLINICAL DATA: Lateral ankle pain and swelling after falling from
bathtub yesterday. Initial encounter.

EXAM:
LEFT ANKLE COMPLETE - 3+ VIEW

[x ankle ap left]
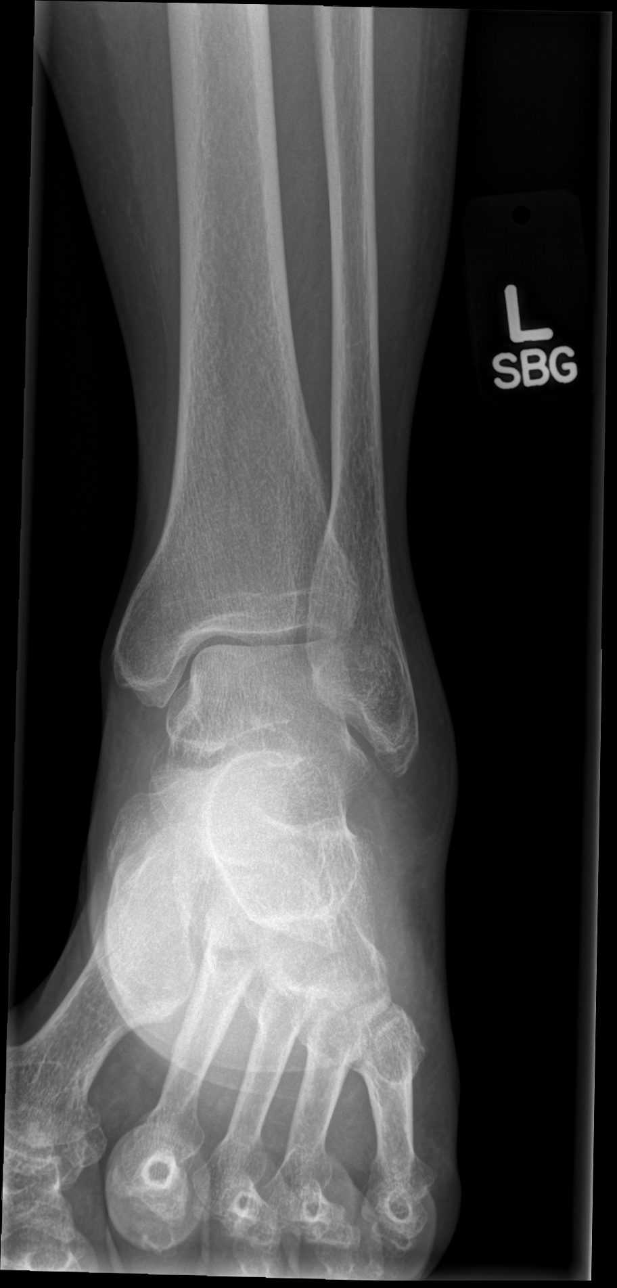

[x ankle obl left (1 of 2)]
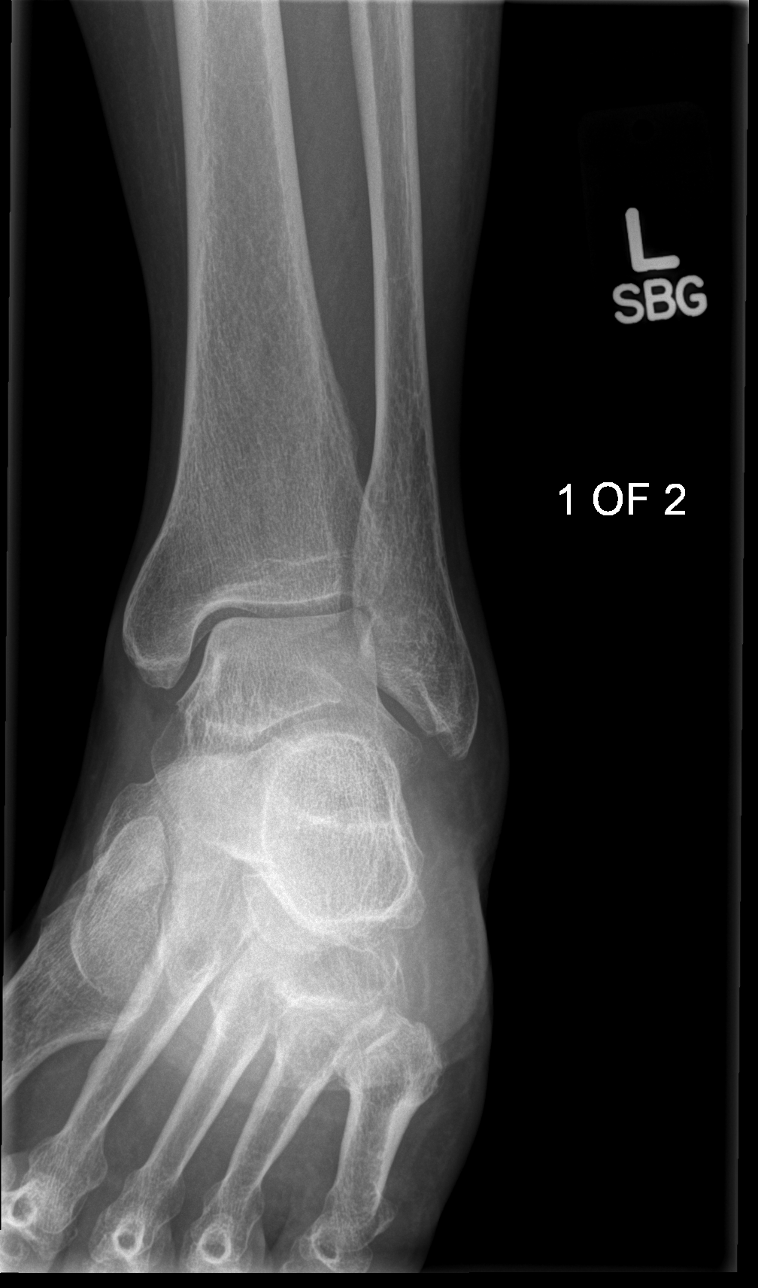

[x ankle obl left (2 of 2)]
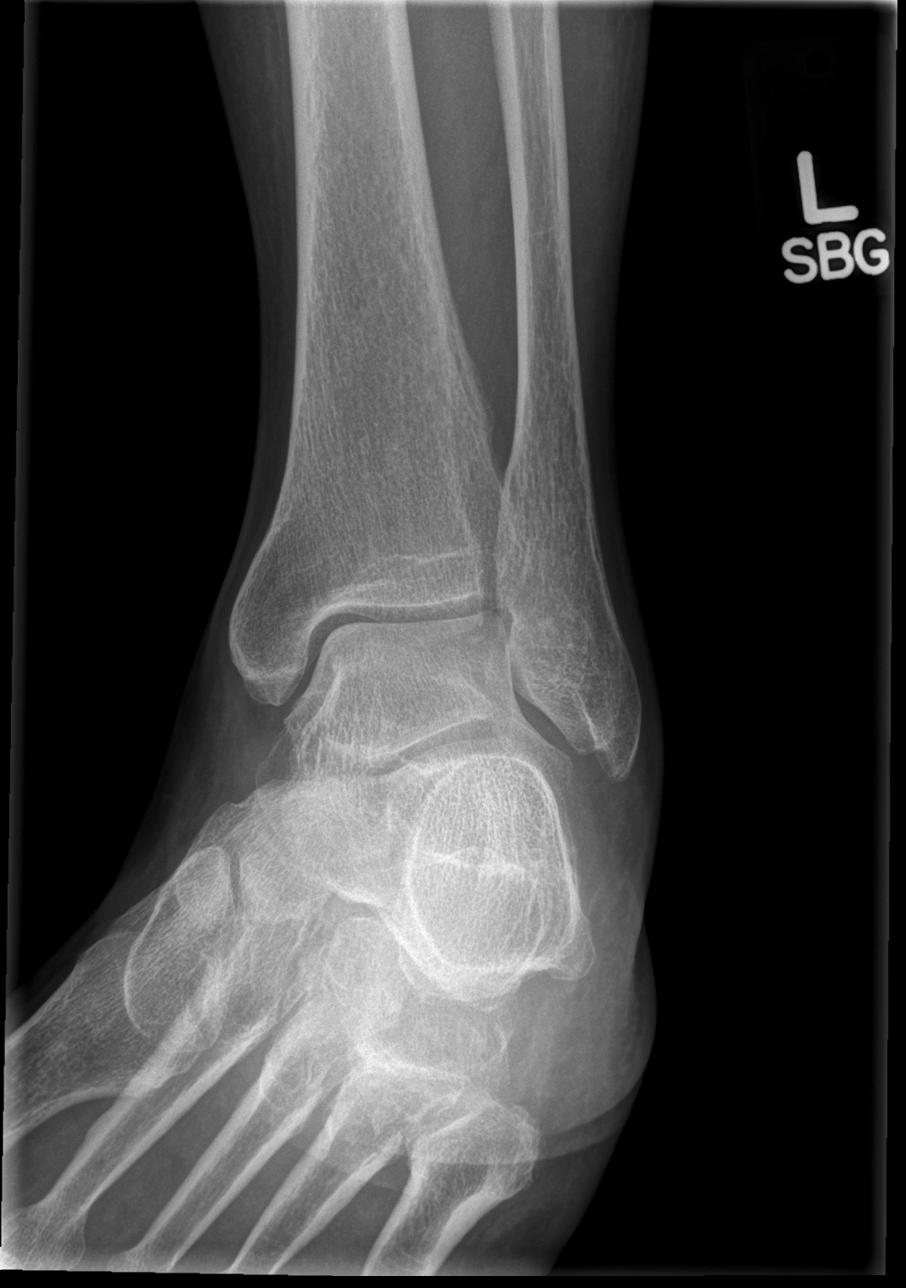

[x ankle lat left]
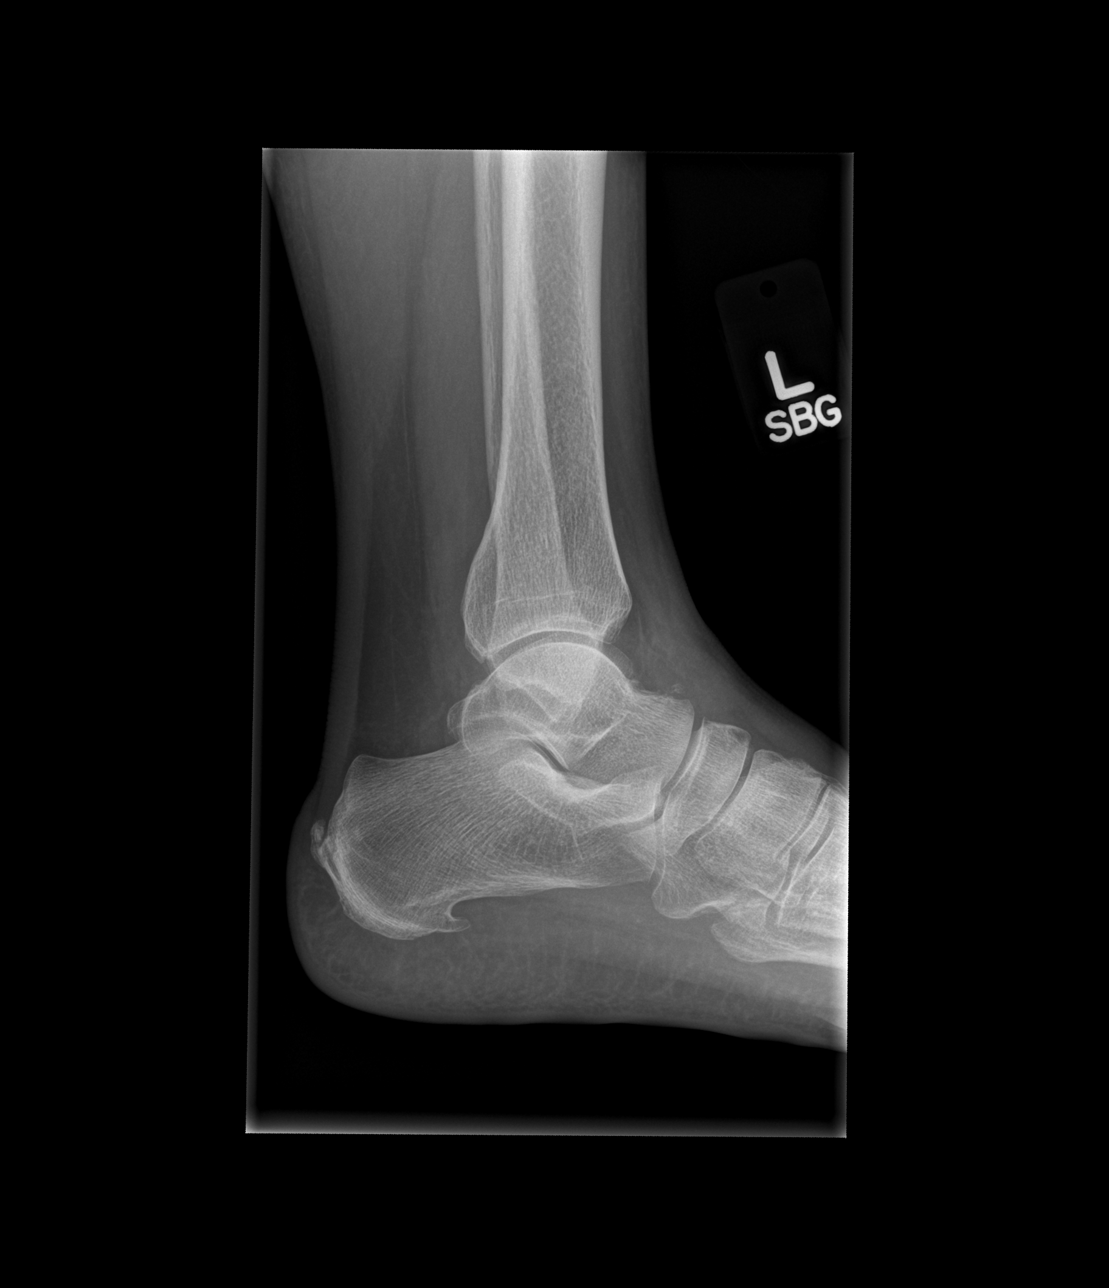

[4 of 4 positions shown; findings below may reference images not displayed]

FINDINGS: The bones appear mildly demineralized. There is no evidence of acute
fracture or dislocation at the ankle. There is mild lateral soft
tissue swelling which extends into the midfoot. Possible small
avulsion fracture laterally from the calcaneus, better seen on the
foot radiographs. Calcaneal spurring noted. Small ossific density
dorsal to the talar head does not appear acute.
IMPRESSION: No acute osseous findings at the ankle. Possible small avulsion
fracture laterally from the calcaneus.

## 2018-03-03 MED ORDER — TRAMADOL HCL 50 MG PO TABS: 50.0000 mg | ORAL_TABLET | Freq: Four times a day (QID) | ORAL | 0 refills | Status: DC | PRN

## 2018-03-03 NOTE — ED Triage Notes (Signed)
To ED via [facility] from home, c/o left ankle pain after falling getting out of tub yesterday. Left lateral mallaleous swollen, positive pedal pulse, good cap refill.

## 2018-03-03 NOTE — ED Provider Notes (Signed)
[name] Provider Note   CSN: [id number] Arrival date & time: 03/03/18  7616     History   Chief Complaint Chief Complaint  Patient presents with  . Fall  . Ankle Pain    HPI Hannah Moon is a 62 y.o. female.  HPI   62 year old female with left lower extremity pain.  Patient slipped and fell while getting out of the bathtub yesterday.  She is had persistent pain in her left foot, left ankle and left shin since that time.  She can bear weight although with increased pain.  No numbness or tingling.  Denies any acute pain elsewhere.  She had increasing pain when she got up this morning and noticed bruising around her distal foot so came in for evaluation.   Past Medical History:  Diagnosis Date  . Hypertension     There are no active problems to display for this patient.   Past Surgical History:  Procedure Laterality Date  . LEG SURGERY       OB History   None      Home Medications    Prior to Admission medications   Medication Sig Start Date End Date Taking? Authorizing Provider  doxycycline (VIBRAMYCIN) 100 MG capsule Take 1 capsule (100 mg total) by mouth 2 (two) times daily. 04/19/17   Margarita Mail, PA-C    Family History No family history on file.  Social History Social History   Tobacco Use  . Smoking status: Current Every Day Smoker    Packs/day: 0.50    Years: 40.00    Pack years: 20.00    Types: Cigarettes  . Smokeless tobacco: Never Used  Substance Use Topics  . Alcohol use: Yes    Alcohol/week: 0.6 oz    Types: 1 Cans of beer per week  . Drug use: No     Allergies   Patient has no known allergies.   Review of Systems Review of Systems  All systems reviewed and negative, other than as noted in HPI.  Physical Exam Updated Vital Signs BP (!) 155/97 (BP Location: Right Arm)   Pulse 83   Temp 98.3 F (36.8 C) (Oral)   Resp 18   SpO2 99%   Physical Exam  Constitutional: She appears  well-developed and well-nourished. No distress.  HENT:  Head: Normocephalic and atraumatic.  Eyes: Conjunctivae are normal. Right eye exhibits no discharge. Left eye exhibits no discharge.  Neck: Neck supple.  Cardiovascular: Normal rate, regular rhythm and normal heart sounds. Exam reveals no gallop and no friction rub.  No murmur heard. Pulmonary/Chest: Effort normal and breath sounds normal. No respiratory distress.  Abdominal: Soft. She exhibits no distension. There is no tenderness.  Musculoskeletal: She exhibits no edema or tenderness.  Ecchymosis around the first second and third MTP joints left foot.  Tenderness to palpation.  She can move all toes although with increased pain.  Tenderness to palpation near the base of the fifth metatarsal there is also some mild swelling and tenderness along the lateral malleolus.  Closed injury.  Palpable DP pulses.  Sensation is intact light touch.  She also has some tenderness around the proximal left fibula.  No knee tenderness, effusion or apparent discomfort with range of motion of left knee or hip.  Neurological: She is alert.  Skin: Skin is warm and dry.  Psychiatric: She has a normal mood and affect. Her behavior is normal. Thought content normal.  Nursing note and vitals reviewed.  ED Treatments / Results  Labs (all labs ordered are listed, but only abnormal results are displayed) Labs Reviewed - No data to display  EKG None  Radiology Dg Ankle Complete Left  Result Date: 03/03/2018 CLINICAL DATA:  Lateral ankle pain and swelling after falling from bathtub yesterday. Initial encounter. EXAM: LEFT ANKLE COMPLETE - 3+ VIEW COMPARISON:  Lower leg radiographs 05/31/2015. FINDINGS: The bones appear mildly demineralized. There is no evidence of acute fracture or dislocation at the ankle. There is mild lateral soft tissue swelling which extends into the midfoot. Possible small avulsion fracture laterally from the calcaneus, better seen on  the foot radiographs. Calcaneal spurring noted. Small ossific density dorsal to the talar head does not appear acute. IMPRESSION: No acute osseous findings at the ankle. Possible small avulsion fracture laterally from the calcaneus. Electronically Signed   By: Richardean Sale M.D.   On: 03/03/2018 09:39   Dg Knee Complete 4 Views Left  Result Date: 03/03/2018 CLINICAL DATA:  Fall from bathtub yesterday. Pain from left hip to left foot. EXAM: LEFT KNEE - COMPLETE 4+ VIEW COMPARISON:  Lower leg radiographs 05/31/2015. FINDINGS: The bones appear mildly demineralized. No evidence of acute fracture or dislocation. The joint spaces appear adequately maintained. There is no joint effusion. There is spurring at the quadriceps insertion on the patella. IMPRESSION: No acute osseous findings. Electronically Signed   By: Richardean Sale M.D.   On: 03/03/2018 09:40   Dg Foot Complete Left  Result Date: 03/03/2018 CLINICAL DATA:  Lateral ankle pain and swelling after falling from bathtub yesterday. Initial encounter. EXAM: LEFT FOOT - COMPLETE 3+ VIEW COMPARISON:  None. FINDINGS: The bones appear demineralized. There is a possible small avulsion fracture laterally from the anterior process of the calcaneus with adjacent soft tissue swelling. No other evidence of acute fracture or dislocation in the foot. Mild degenerative changes are present at the 1st metatarsophalangeal joint. IMPRESSION: Possible small avulsion fracture laterally from the anterior process of the calcaneus. Electronically Signed   By: Richardean Sale M.D.   On: 03/03/2018 09:37    Procedures Procedures (including critical care time)  Medications Ordered in ED Medications - No data to display   Initial Impression / Assessment and Plan / ED Course  I have reviewed the triage vital signs and the nursing notes.  Pertinent labs & imaging results that were available during my care of the patient were reviewed by me and considered in my medical  decision making (see chart for details).     62yF with pain in LLE after mechanical fall. Will image. Closed injuries. NVI. She is declining pain medication currently.   Possible avulsion fx of calcaneus. Cam walker. PRN pain meds. RICE. Ortho FU this upcoming week.  It has been determined that no acute conditions requiring further emergency intervention are present at this time. The patient has been advised of the diagnosis and plan. I reviewed any labs and imaging including any potential incidental findings. We have discussed signs and symptoms that warrant return to the ED and they are listed in the discharge instructions.    Final Clinical Impressions(s) / ED Diagnoses   Final diagnoses:  Sprain of left ankle, unspecified ligament, initial encounter  Contusion of left foot, initial encounter    ED Discharge Orders    None       Virgel Manifold, MD 03/03/18 [phone]

## 2018-03-03 NOTE — ED Notes (Signed)
Patient transported to X-ray 

## 2018-03-03 NOTE — Discharge Instructions (Addendum)
Unless you have been previously told otherwise, take 400 mg of ibuprofen every 6 hours as needed for pain. Tramadol is for break through pain as needed. Keep leg elevated when off of it. Ice for the next 24 hours. You can bear weight on your L foot/leg as tolerated while in cam walker. You may potentially have a small avulsion (chip) fracture of your calcaneus (heel bone). Follow-up with orthopedic surgery this upcoming week.

## 2018-03-03 NOTE — Progress Notes (Signed)
Orthopedic Tech Progress Note Patient Details:  Hannah Moon 1956-05-11 [id number]     Post Interventions Patient Tolerated: Well Instructions Provided: Care of device    Ortho Devices Type of Ortho Device: CAM walker Ortho Device/Splint Location: lle Ortho Device/Splint Interventions: Application   Post Interventions Patient Tolerated: Well Instructions Provided: Care of device   Hildred Priest 03/03/2018, 10:12 AM

## 2018-03-03 NOTE — ED Notes (Signed)
Ortho Tech Paged

## 2018-07-23 ENCOUNTER — Other Ambulatory Visit: Payer: Self-pay | Admitting: Family

## 2018-07-23 DIAGNOSIS — Z1231 Encounter for screening mammogram for malignant neoplasm of breast: Secondary | ICD-10-CM

## 2018-08-27 ENCOUNTER — Ambulatory Visit: Payer: Self-pay

## 2019-05-30 ENCOUNTER — Other Ambulatory Visit: Payer: Self-pay

## 2019-05-30 ENCOUNTER — Ambulatory Visit
Admission: RE | Admit: 2019-05-30 | Discharge: 2019-05-30 | Disposition: A | Discharge status: Home / Self Care | Payer: Medicare HMO | Source: Ambulatory Visit | Attending: Family | Admitting: Family

## 2019-05-30 DIAGNOSIS — Z1231 Encounter for screening mammogram for malignant neoplasm of breast: Secondary | ICD-10-CM

## 2019-05-30 IMAGING — MG DIGITAL SCREENING BILAT W/ TOMO
8 series · 8 of 24 positions shown · non-contrast
Comparison: None.

CLINICAL DATA: Screening.

EXAM:
DIGITAL SCREENING BILATERAL MAMMOGRAM WITH TOMO AND CAD

[L CC synth-2D]
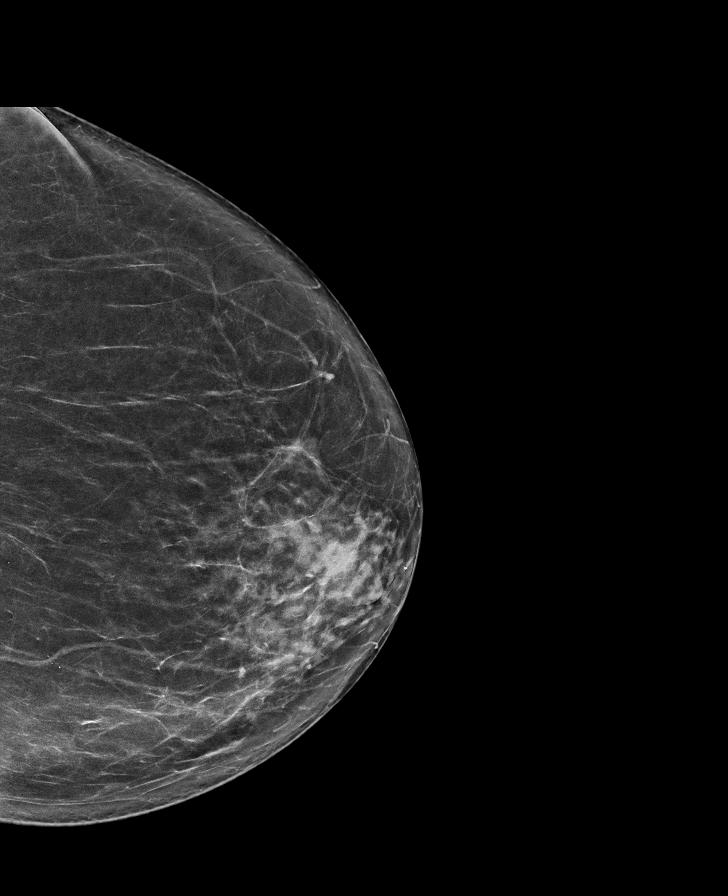

[R CC synth-2D]
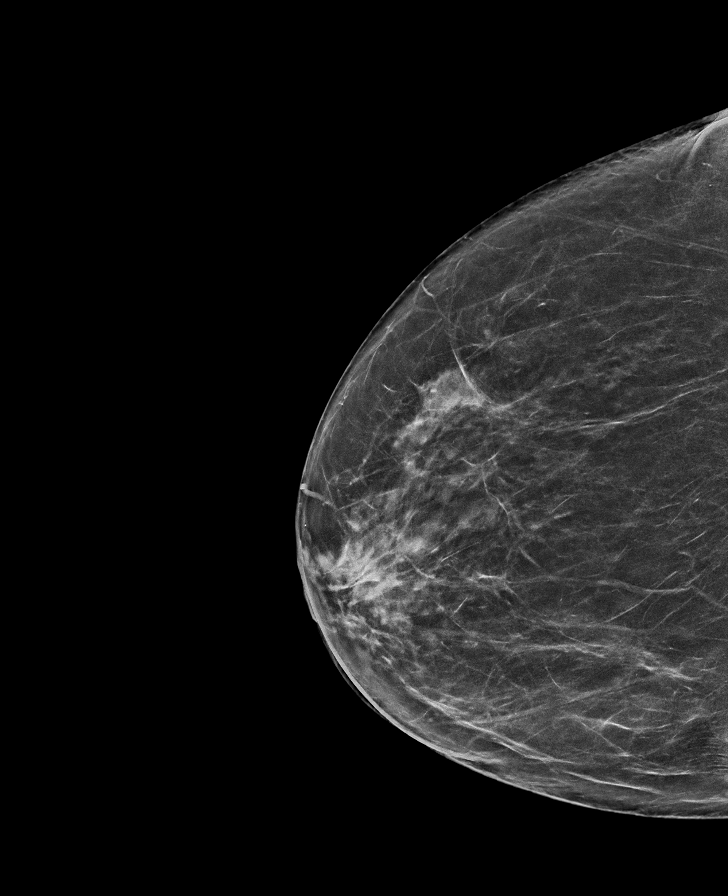

[R MLO synth-2D]
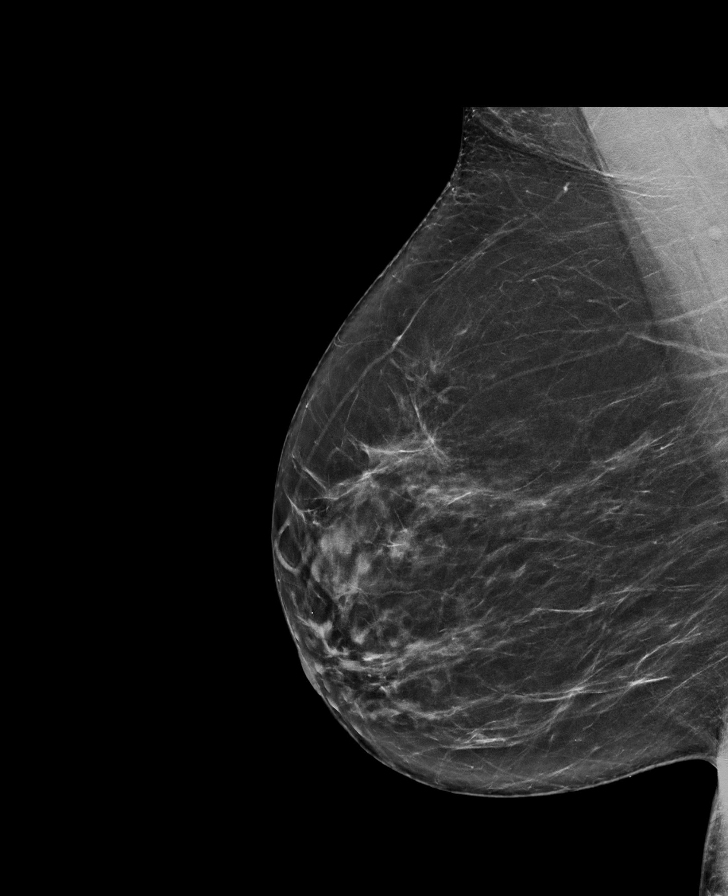

[L MLO synth-2D]
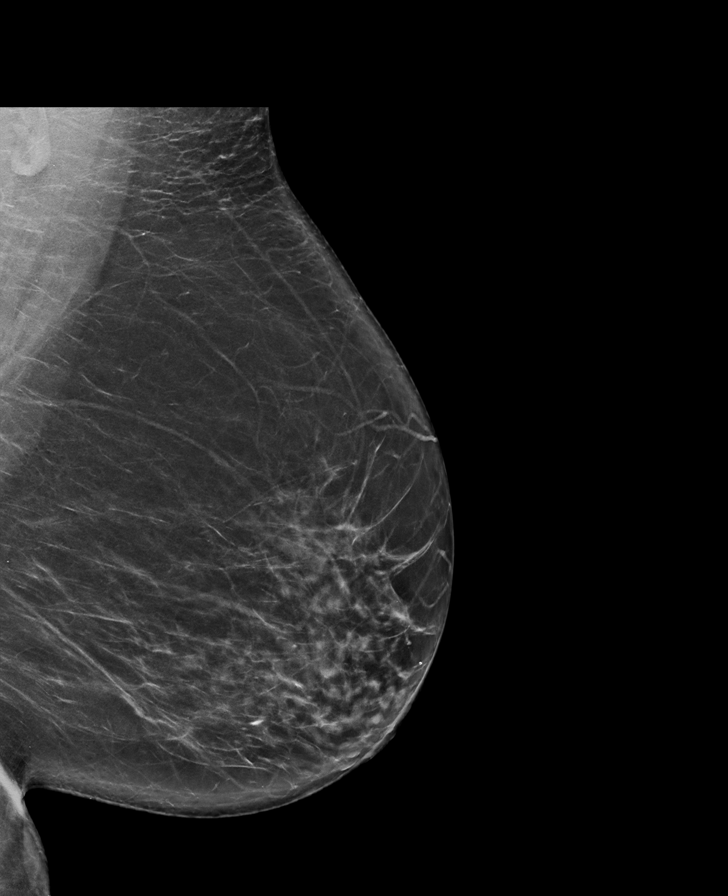

[R CC tomo · tomo slice 37/72.0]
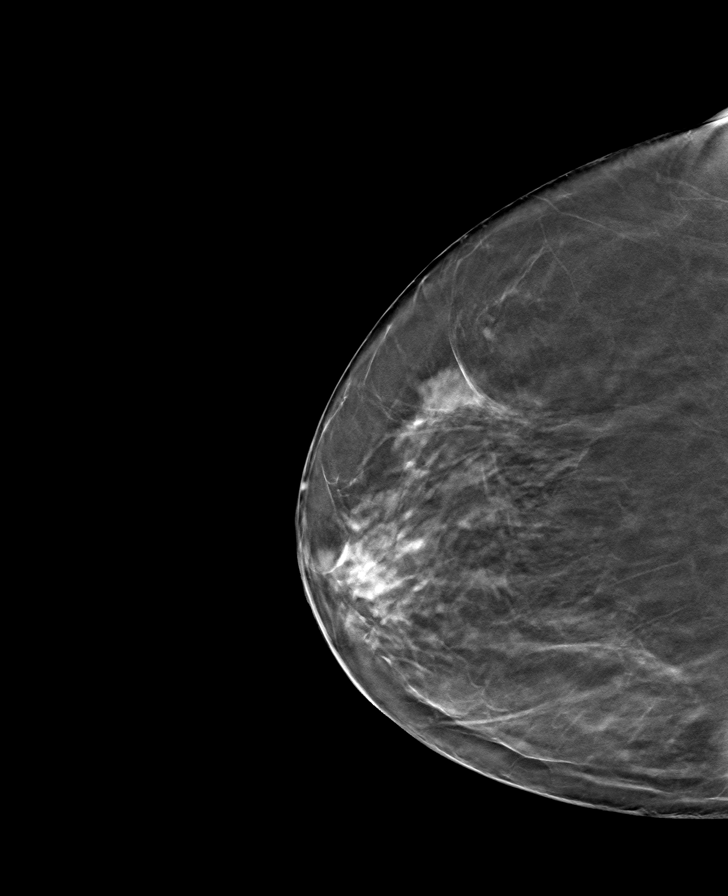

[R MLO tomo · tomo slice 39/76.0]
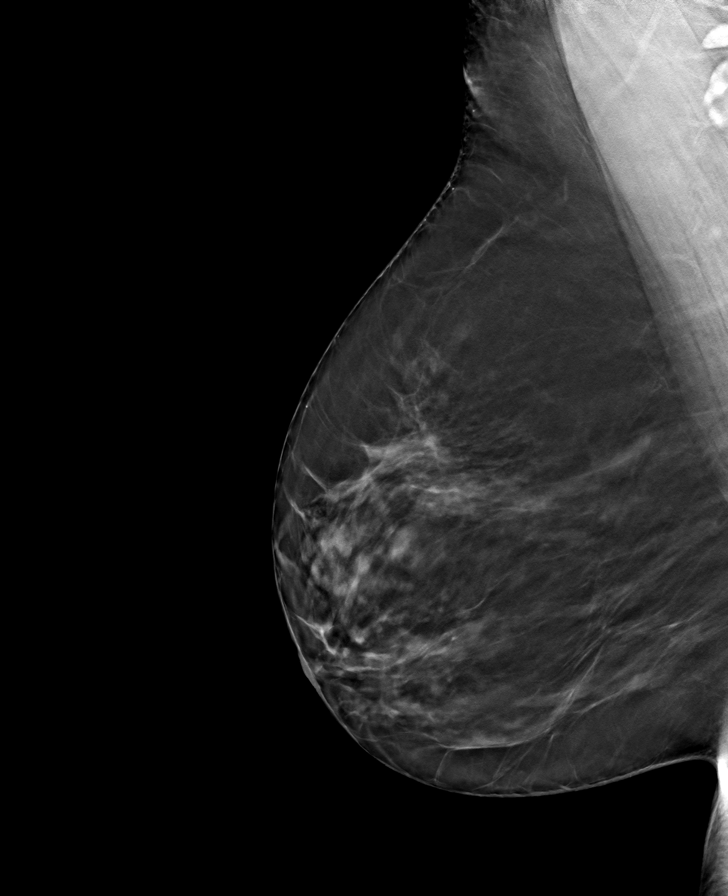

[L CC tomo · tomo slice 38/75.0]
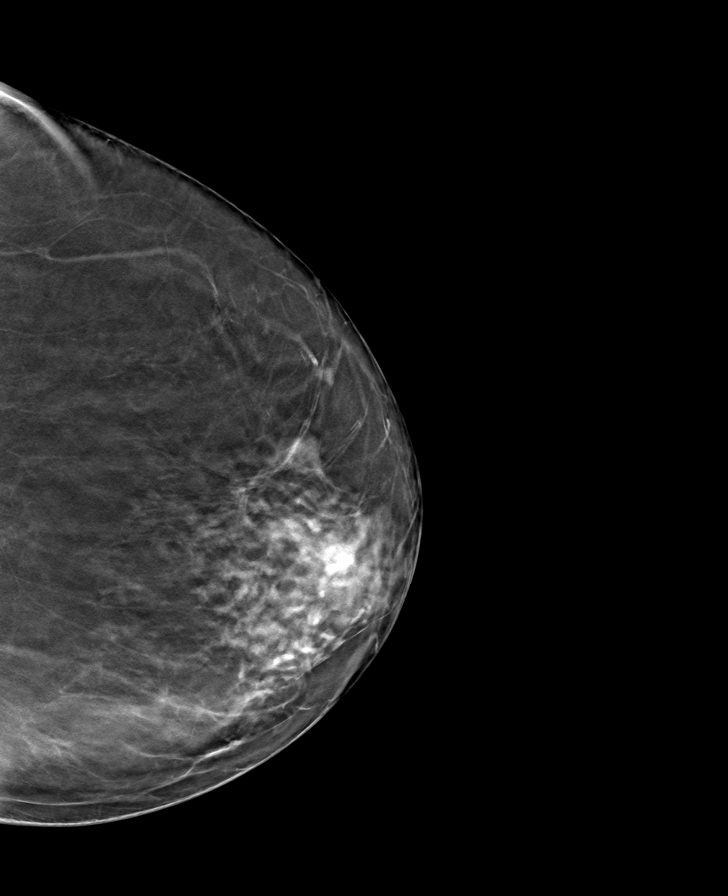

[L MLO tomo · tomo slice 39/78.0]
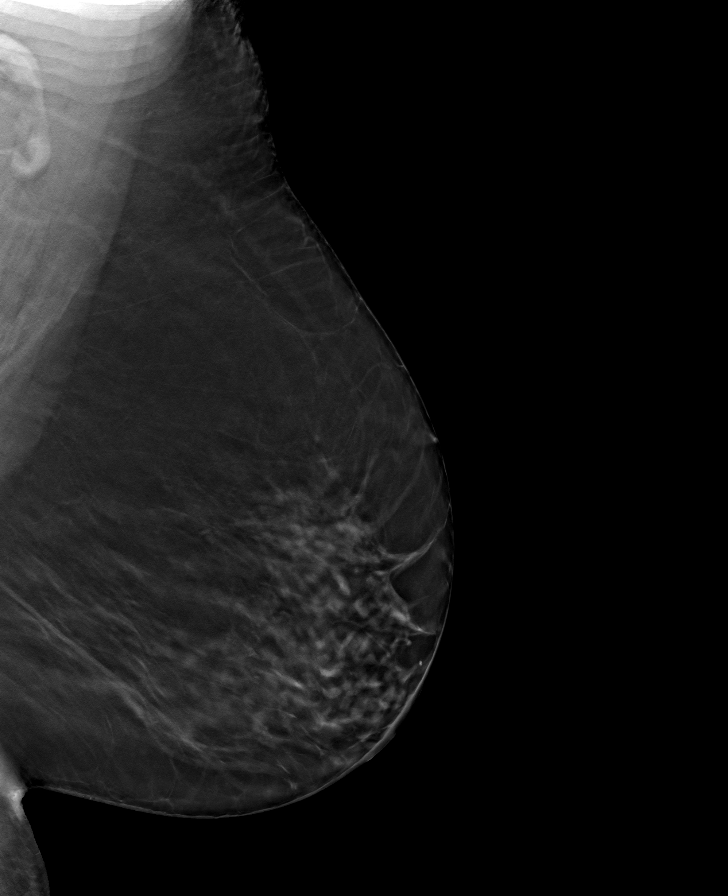

[8 of 24 positions shown; findings below may reference images not displayed]

ACR Breast Density Category b: There are scattered areas of
fibroglandular density.
FINDINGS: There are no findings suspicious for malignancy. Images were
processed with CAD.
IMPRESSION: No mammographic evidence of malignancy. A result letter of this
screening mammogram will be mailed directly to the patient.

RECOMMENDATION:
Screening mammogram in one year. (Code:Y5-G-EJ6)

BI-RADS CATEGORY  1: Negative.

## 2019-06-06 ENCOUNTER — Other Ambulatory Visit: Payer: Self-pay | Admitting: Family

## 2019-06-06 ENCOUNTER — Other Ambulatory Visit: Payer: Self-pay

## 2019-06-06 ENCOUNTER — Ambulatory Visit
Admission: RE | Admit: 2019-06-06 | Discharge: 2019-06-06 | Disposition: A | Discharge status: Home / Self Care | Payer: Medicare HMO | Source: Ambulatory Visit | Attending: Family | Admitting: Family

## 2019-06-06 DIAGNOSIS — M25512 Pain in left shoulder: Secondary | ICD-10-CM

## 2019-06-06 IMAGING — CR DG SHOULDER 2+V*L*
3 series · 3 of 3 positions shown · non-contrast
Comparison: None.

CLINICAL DATA: Left shoulder pain for 1-2 months.

EXAM:
LEFT SHOULDER - 2+ VIEW

[w shoulder grashey left]
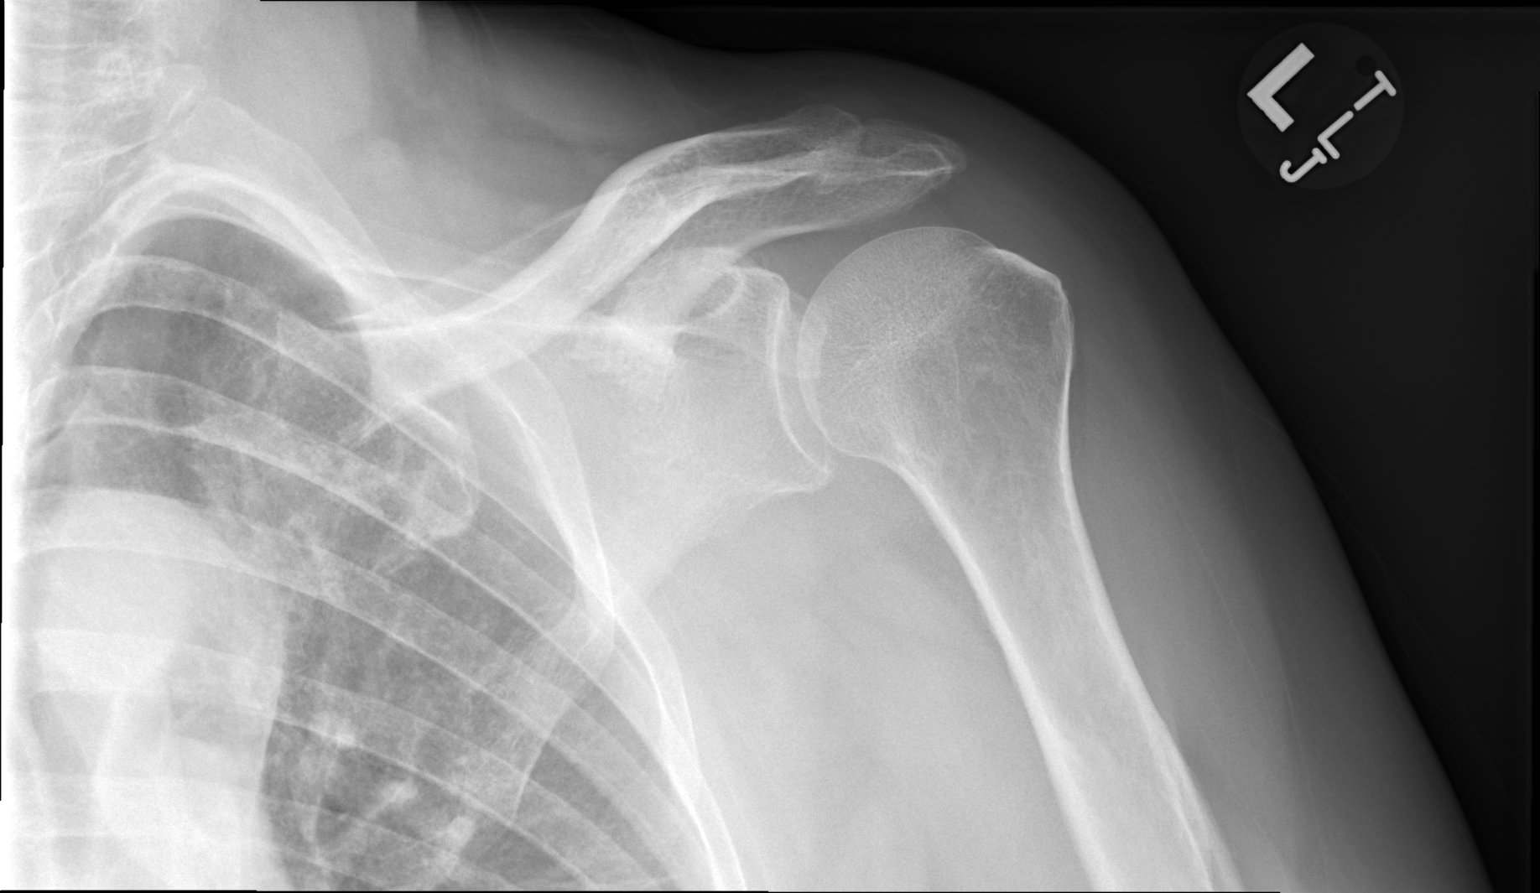

[w shoulder y-view left]
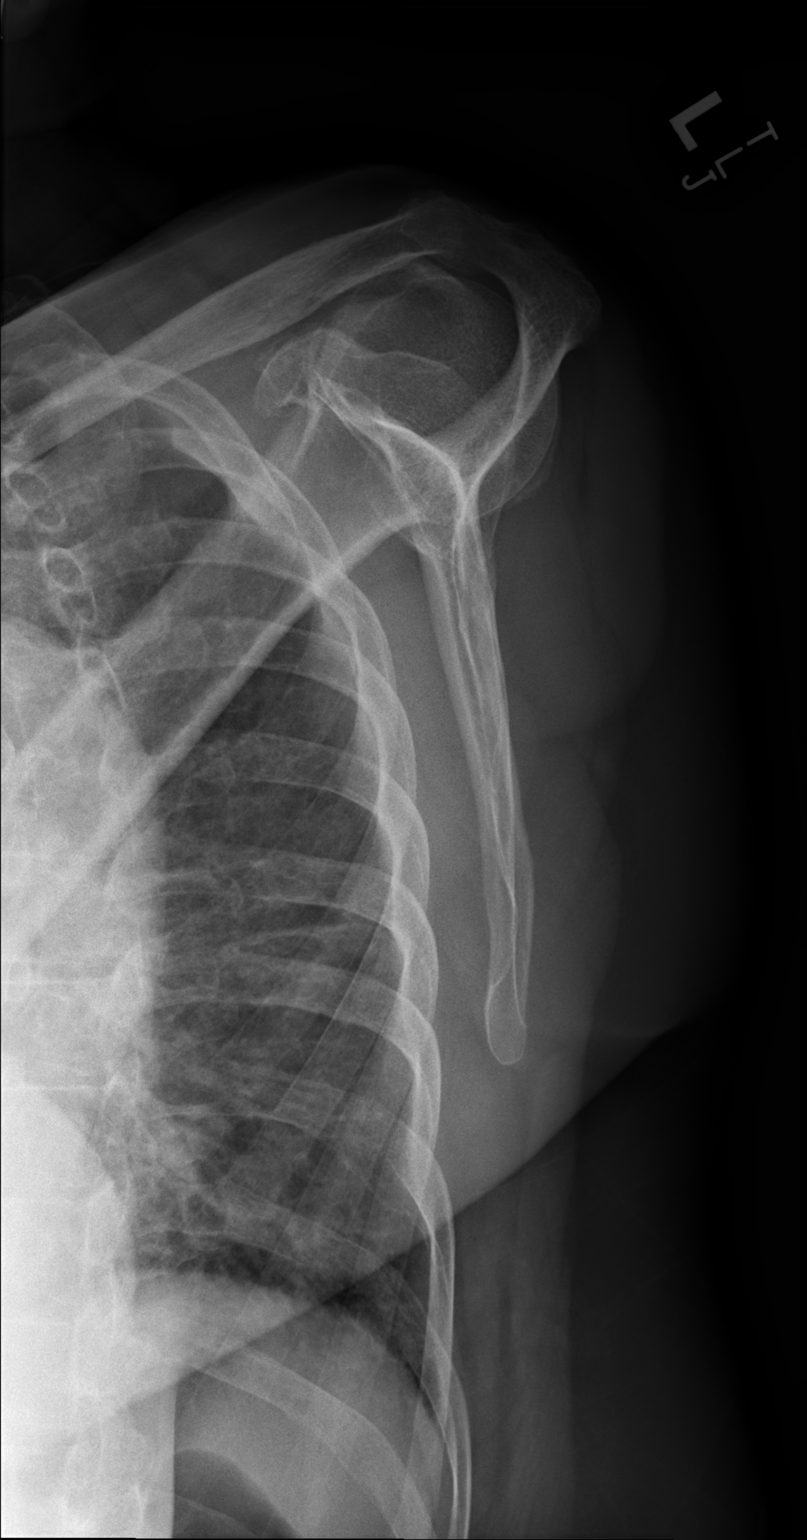

[w shoulder axillary left]
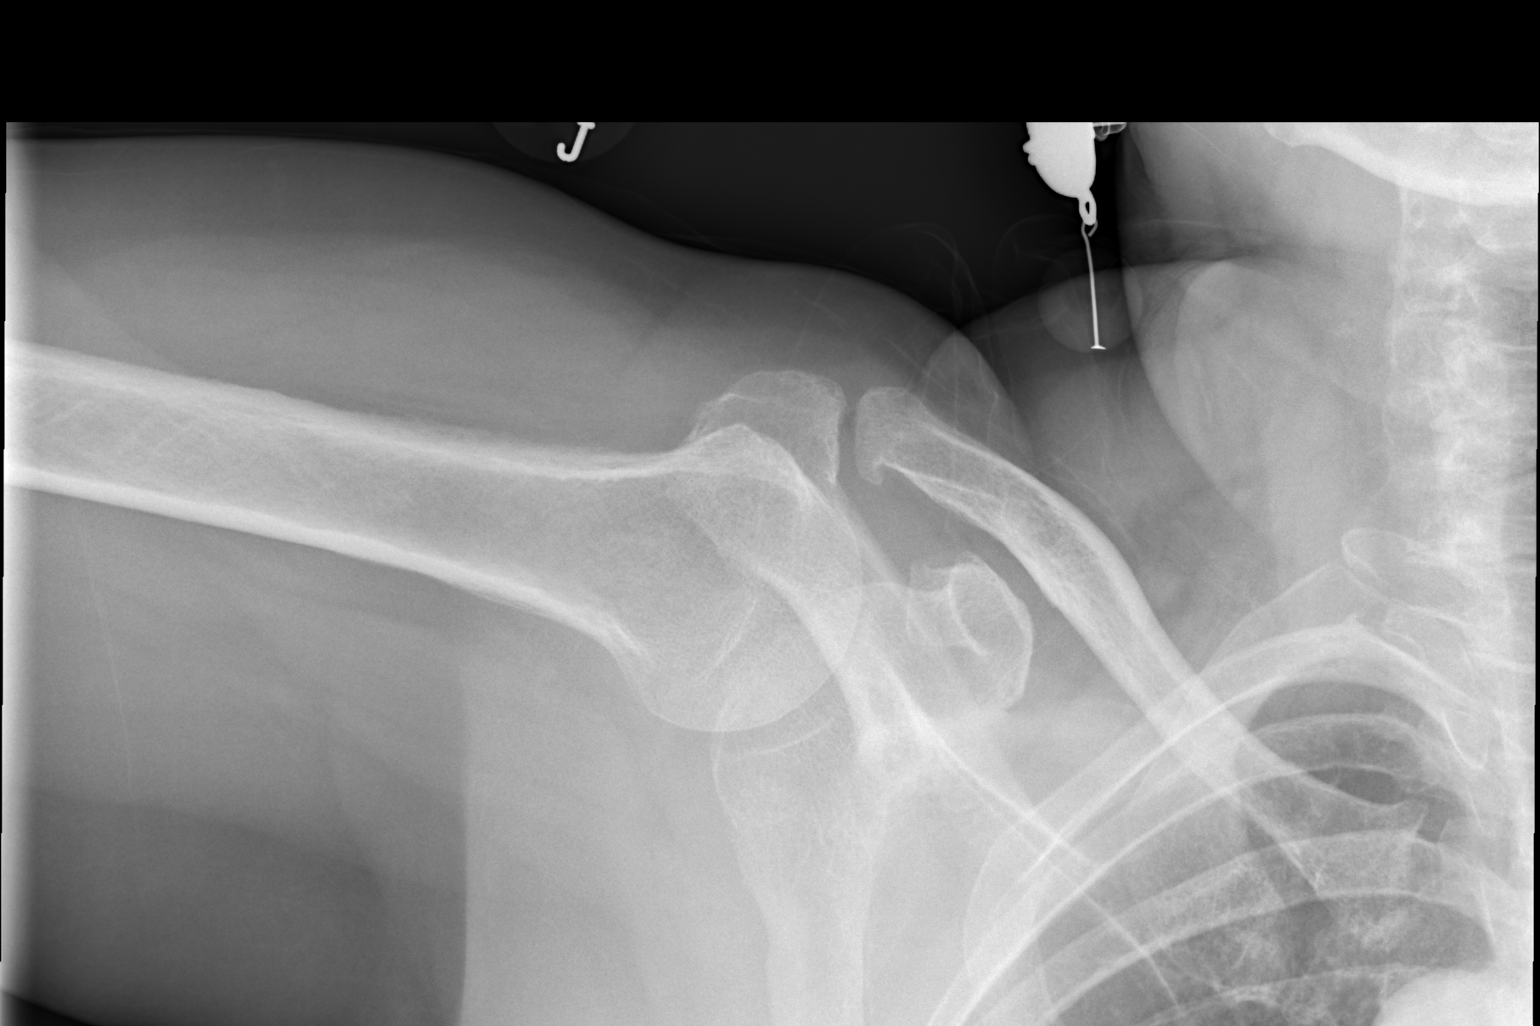

[3 of 3 positions shown; findings below may reference images not displayed]

FINDINGS: There is no fracture or dislocation. The glenohumeral joint is
normal. There are mild degenerative changes of the acromioclavicular
joint.
IMPRESSION: No acute osseous injury of the left shoulder.

Mild arthropathy of acromioclavicular joint.

## 2019-06-12 ENCOUNTER — Encounter: Payer: Self-pay | Admitting: Gastroenterology

## 2019-07-03 ENCOUNTER — Other Ambulatory Visit: Payer: Self-pay

## 2019-07-03 ENCOUNTER — Ambulatory Visit (AMBULATORY_SURGERY_CENTER): Payer: Self-pay

## 2019-07-03 VITALS — Temp 96.0°F | Ht 61.0 in | Wt 182.4 lb

## 2019-07-03 DIAGNOSIS — Z1211 Encounter for screening for malignant neoplasm of colon: Secondary | ICD-10-CM

## 2019-07-03 MED ORDER — NA SULFATE-K SULFATE-MG SULF 17.5-3.13-1.6 GM/177ML PO SOLN: 1.0000 | Freq: Once | ORAL | 0 refills | Status: AC

## 2019-07-03 NOTE — Progress Notes (Signed)
Denies allergies to eggs or soy products. Denies complication of anesthesia or sedation. Denies use of weight loss medication. Denies use of O2.   Emmi instructions given for colonoscopy.  Covid screening is scheduled for Tuesday 07/15/19 @ 11:00 Am.

## 2019-07-15 ENCOUNTER — Other Ambulatory Visit: Payer: Self-pay | Admitting: Gastroenterology

## 2019-07-15 ENCOUNTER — Ambulatory Visit (INDEPENDENT_AMBULATORY_CARE_PROVIDER_SITE_OTHER): Payer: Medicare HMO

## 2019-07-15 DIAGNOSIS — Z1159 Encounter for screening for other viral diseases: Secondary | ICD-10-CM

## 2019-07-15 LAB — SARS CORONAVIRUS 2 (TAT 6-24 HRS): SARS Coronavirus 2: NEGATIVE

## 2019-07-18 ENCOUNTER — Other Ambulatory Visit: Payer: Self-pay

## 2019-07-18 ENCOUNTER — Ambulatory Visit (AMBULATORY_SURGERY_CENTER): Payer: Medicare HMO | Admitting: Gastroenterology

## 2019-07-18 ENCOUNTER — Encounter: Payer: Self-pay | Admitting: Gastroenterology

## 2019-07-18 VITALS — BP 118/79 | HR 77 | Temp 98.3°F | Resp 19 | Ht 61.0 in | Wt 182.0 lb

## 2019-07-18 DIAGNOSIS — D122 Benign neoplasm of ascending colon: Secondary | ICD-10-CM

## 2019-07-18 DIAGNOSIS — Z1211 Encounter for screening for malignant neoplasm of colon: Secondary | ICD-10-CM

## 2019-07-18 MED ORDER — SODIUM CHLORIDE 0.9 % IV SOLN: 500.0000 mL | Freq: Once | INTRAVENOUS | Status: DC

## 2019-07-18 NOTE — Progress Notes (Signed)
Pt's states no medical or surgical changes since previsit or office visit. VS by CW. Temp by JB 

## 2019-07-18 NOTE — Op Note (Signed)
[facility] Patient Name: Hannah Moon Procedure Date: 07/18/2019 10:57 AM MRN: [id number] Endoscopist: Thornton Park MD, MD Age: 63 Referring MD:  Date of Birth: [birth year] Gender: Female Account #: [id number] Procedure:                Colonoscopy Indications:              Screening for colorectal malignant neoplasm, This                            is the patient's first colonoscopy Medicines:                Monitored Anesthesia Care Procedure:                Pre-Anesthesia Assessment:                           - Prior to the procedure, a History and Physical                            was performed, and patient medications and                            allergies were reviewed. The patient's tolerance of                            previous anesthesia was also reviewed. The risks                            and benefits of the procedure and the sedation                            options and risks were discussed with the patient.                            All questions were answered, and informed consent                            was obtained. Prior Anticoagulants: The patient has                            taken no previous anticoagulant or antiplatelet                            agents. ASA Grade Assessment: II - A patient with                            mild systemic disease. After reviewing the risks                            and benefits, the patient was deemed in                            satisfactory condition to undergo the procedure.  After obtaining informed consent, the colonoscope                            was passed under direct vision. Throughout the                            procedure, the patient's blood pressure, pulse, and                            oxygen saturations were monitored continuously. The                            Colonoscope was introduced through the anus and                            advanced to the 4 cm  into the ileum. The                            colonoscopy was performed without difficulty. The                            patient tolerated the procedure well. The quality                            of the bowel preparation was good. The terminal                            ileum, ileocecal valve, appendiceal orifice, and                            rectum were photographed. Scope In: 11:04:53 AM Scope Out: 11:15:33 AM Scope Withdrawal Time: 0 hours 8 minutes 5 seconds  Total Procedure Duration: 0 hours 10 minutes 40 seconds  Findings:                 The perianal and digital rectal examinations were                            normal.                           A 3 mm polyp was found in the ascending colon. The                            polyp was sessile. The polyp was removed with a                            cold snare in a piecemeal fashion. Resection and                            retrieval were complete. Estimated blood loss was                            minimal.  The exam was otherwise without abnormality on                            direct and retroflexion views. Complications:            No immediate complications. Estimated blood loss:                            Minimal. Estimated Blood Loss:     Estimated blood loss was minimal. Impression:               - One 3 mm polyp in the ascending colon, removed                            with a cold snare. Resected and retrieved.                           - The examination was otherwise normal on direct                            and retroflexion views. Recommendation:           - Patient has a contact number available for                            emergencies. The signs and symptoms of potential                            delayed complications were discussed with the                            patient. Return to normal activities tomorrow.                            Written discharge instructions were  provided to the                            patient.                           - Resume previous diet.                           - Continue present medications.                           - Await pathology results.                           - Repeat colonoscopy date to be determined after                            pending pathology results are reviewed for                            surveillance. Thornton Park MD, MD 07/18/2019 11:22:59 AM This report has been signed electronically.

## 2019-07-18 NOTE — Progress Notes (Signed)
Called to room to assist during endoscopic procedure.  Patient ID and intended procedure confirmed with present staff. Received instructions for my participation in the procedure from the performing physician.  

## 2019-07-18 NOTE — Patient Instructions (Signed)
YOU HAD AN ENDOSCOPIC PROCEDURE TODAY AT THE [facility]:   Refer to the procedure report that was given to you for any specific questions about what was found during the examination.  If the procedure report does not answer your questions, please call your gastroenterologist to clarify.  If you requested that your care partner not be given the details of your procedure findings, then the procedure report has been included in a sealed envelope for you to review at your convenience later.  YOU SHOULD EXPECT: Some feelings of bloating in the abdomen. Passage of more gas than usual.  Walking can help get rid of the air that was put into your GI tract during the procedure and reduce the bloating. If you had a lower endoscopy (such as a colonoscopy or flexible sigmoidoscopy) you may notice spotting of blood in your stool or on the toilet paper. If you underwent a bowel prep for your procedure, you may not have a normal bowel movement for a few days.  Please Note:  You might notice some irritation and congestion in your nose or some drainage.  This is from the oxygen used during your procedure.  There is no need for concern and it should clear up in a day or so.  SYMPTOMS TO REPORT IMMEDIATELY:   Following lower endoscopy (colonoscopy or flexible sigmoidoscopy):  Excessive amounts of blood in the stool  Significant tenderness or worsening of abdominal pains  Swelling of the abdomen that is new, acute  Fever of 100F or higher  For urgent or emergent issues, a gastroenterologist can be reached at any hour by calling [phone].   DIET:  We do recommend a small meal at first, but then you may proceed to your regular diet.  Drink plenty of fluids but you should avoid alcoholic beverages for 24 hours.  ACTIVITY:  You should plan to take it easy for the rest of today and you should NOT DRIVE or use heavy machinery until tomorrow (because of the sedation medicines used during the test).     FOLLOW UP: Our staff will call the number listed on your records 48-72 hours following your procedure to check on you and address any questions or concerns that you may have regarding the information given to you following your procedure. If we do not reach you, we will leave a message.  We will attempt to reach you two times.  During this call, we will ask if you have developed any symptoms of COVID 19. If you develop any symptoms (ie: fever, flu-like symptoms, shortness of breath, cough etc.) before then, please call [phone].  If you test positive for Covid 19 in the 2 weeks post procedure, please call and report this information to us.    If any biopsies were taken you will be contacted by phone or by letter within the next 1-3 weeks.  Please call us at [phone] if you have not heard about the biopsies in 3 weeks.    SIGNATURES/CONFIDENTIALITY: You and/or your care partner have signed paperwork which will be entered into your electronic medical record.  These signatures attest to the fact that that the information above on your After Visit Summary has been reviewed and is understood.  Full responsibility of the confidentiality of this discharge information lies with you and/or your care-partner. 

## 2019-07-18 NOTE — Progress Notes (Signed)
PT taken to PACU. Monitors in place. VSS. Report given to RN. 

## 2019-07-22 ENCOUNTER — Telehealth: Payer: Self-pay | Admitting: *Deleted

## 2019-07-22 ENCOUNTER — Telehealth: Payer: Self-pay

## 2019-07-22 ENCOUNTER — Encounter: Payer: Self-pay | Admitting: Gastroenterology

## 2019-07-22 NOTE — Telephone Encounter (Signed)
1st follow up call made.  NAULM 

## 2019-07-22 NOTE — Telephone Encounter (Signed)
Second follow up call attempt.  No answer or ability to leave a message.

## 2019-08-04 ENCOUNTER — Other Ambulatory Visit: Payer: Self-pay

## 2019-08-04 ENCOUNTER — Ambulatory Visit
Admission: RE | Admit: 2019-08-04 | Discharge: 2019-08-04 | Disposition: A | Discharge status: Home / Self Care | Payer: Medicare HMO | Source: Ambulatory Visit | Attending: Internal Medicine | Admitting: Internal Medicine

## 2019-08-04 ENCOUNTER — Other Ambulatory Visit: Payer: Self-pay | Admitting: Internal Medicine

## 2019-08-04 DIAGNOSIS — M25562 Pain in left knee: Secondary | ICD-10-CM

## 2019-08-04 IMAGING — CR DG KNEE 1-2V*L*
2 series · 2 of 2 positions shown · non-contrast
Comparison: 03/03/2018

CLINICAL DATA: Left knee pain

EXAM:
LEFT KNEE - 1-2 VIEW

[w knee ap left]
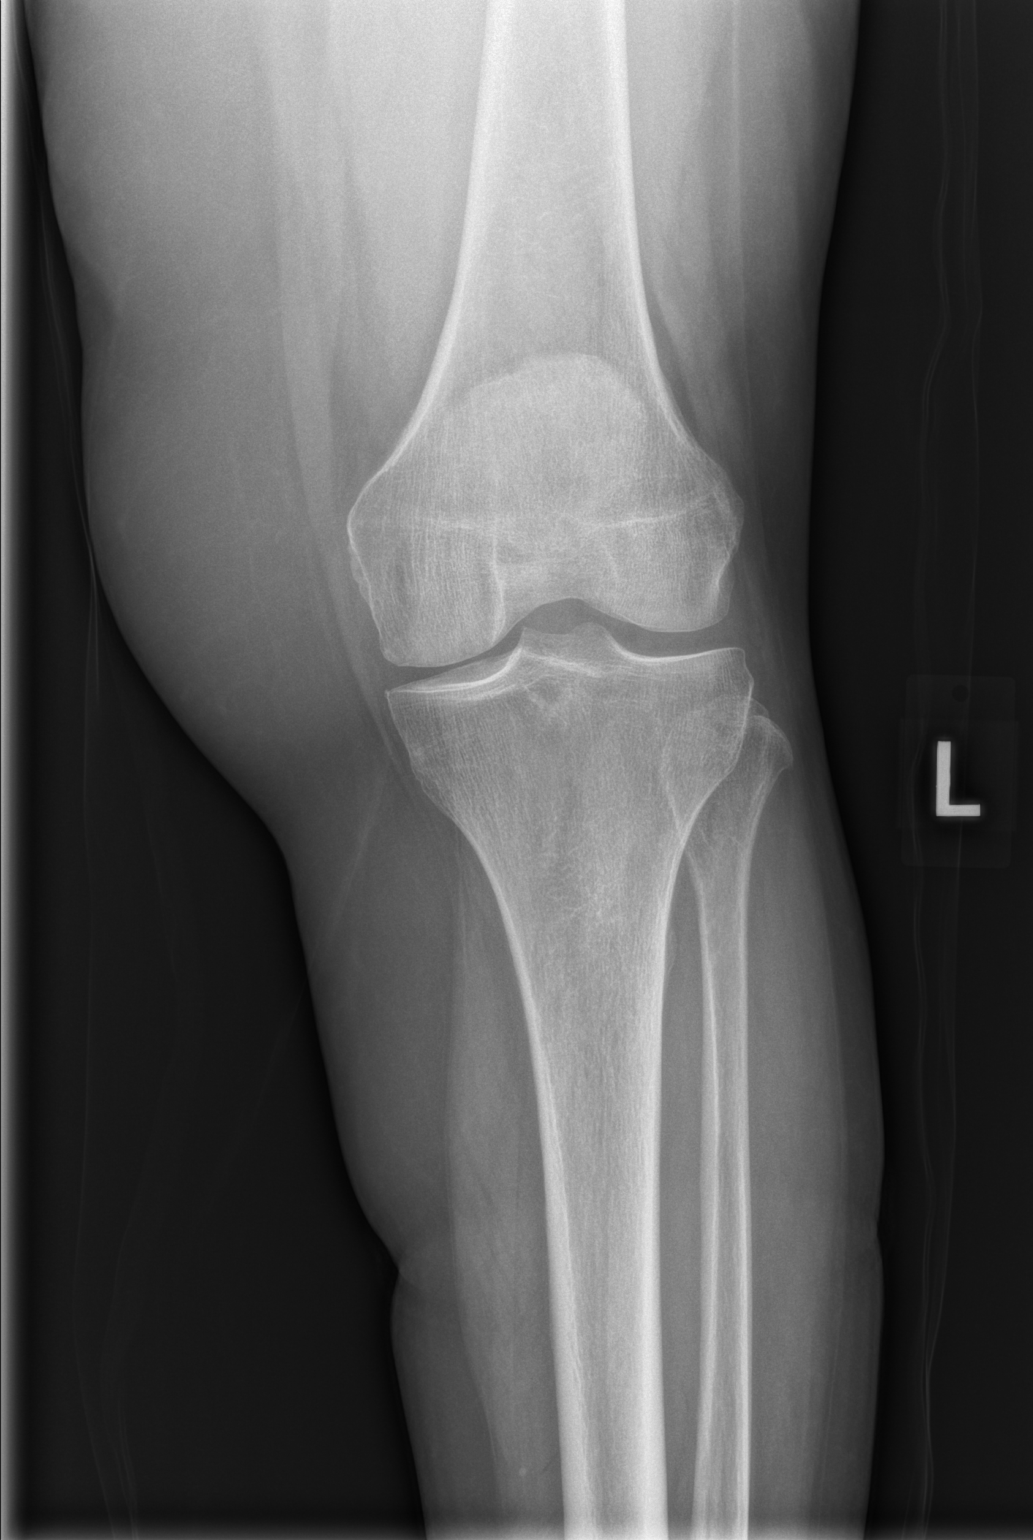

[w knee lat left]
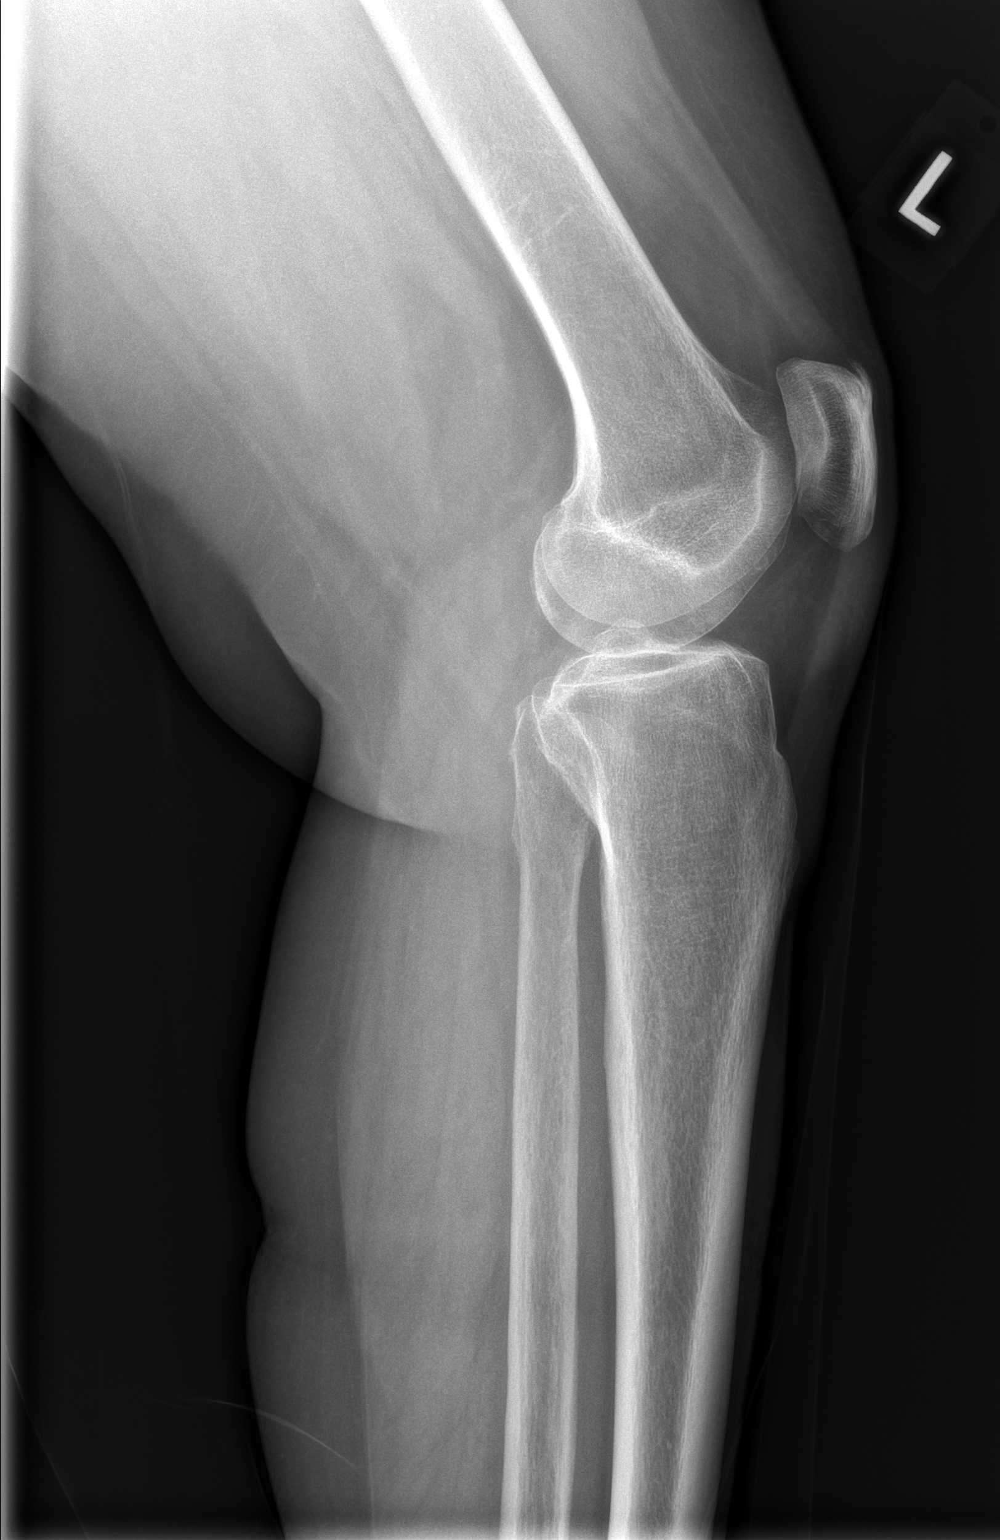

[2 of 2 positions shown; findings below may reference images not displayed]

FINDINGS: No fracture or dislocation of the left knee. Joint spaces are
preserved. No knee joint effusion. Soft tissues are unremarkable.
IMPRESSION: No fracture or dislocation of the left knee. Joint spaces are
preserved. Findings are not significantly changed compared to prior
radiographs dated 03/03/2018.

## 2019-08-26 DIAGNOSIS — Z008 Encounter for other general examination: Secondary | ICD-10-CM | POA: Diagnosis not present

## 2019-08-26 DIAGNOSIS — I10 Essential (primary) hypertension: Secondary | ICD-10-CM | POA: Diagnosis not present

## 2019-08-26 DIAGNOSIS — H43393 Other vitreous opacities, bilateral: Secondary | ICD-10-CM | POA: Diagnosis not present

## 2019-08-26 DIAGNOSIS — M8949 Other hypertrophic osteoarthropathy, multiple sites: Secondary | ICD-10-CM | POA: Diagnosis not present

## 2019-08-26 DIAGNOSIS — J449 Chronic obstructive pulmonary disease, unspecified: Secondary | ICD-10-CM | POA: Diagnosis not present

## 2019-08-26 DIAGNOSIS — E6609 Other obesity due to excess calories: Secondary | ICD-10-CM | POA: Diagnosis not present

## 2019-08-26 DIAGNOSIS — F17210 Nicotine dependence, cigarettes, uncomplicated: Secondary | ICD-10-CM | POA: Diagnosis not present

## 2019-08-26 DIAGNOSIS — F1020 Alcohol dependence, uncomplicated: Secondary | ICD-10-CM | POA: Diagnosis not present

## 2019-08-26 DIAGNOSIS — F419 Anxiety disorder, unspecified: Secondary | ICD-10-CM | POA: Diagnosis not present

## 2019-11-26 ENCOUNTER — Other Ambulatory Visit: Payer: Self-pay | Admitting: Family

## 2019-11-26 DIAGNOSIS — E2839 Other primary ovarian failure: Secondary | ICD-10-CM

## 2020-03-15 ENCOUNTER — Other Ambulatory Visit: Payer: Medicare HMO

## 2020-05-11 ENCOUNTER — Other Ambulatory Visit: Payer: Self-pay

## 2020-05-11 ENCOUNTER — Ambulatory Visit
Admission: RE | Admit: 2020-05-11 | Discharge: 2020-05-11 | Disposition: A | Discharge status: Home / Self Care | Payer: Medicare HMO | Source: Ambulatory Visit | Attending: Family | Admitting: Family

## 2020-05-11 DIAGNOSIS — E2839 Other primary ovarian failure: Secondary | ICD-10-CM

## 2021-06-07 ENCOUNTER — Other Ambulatory Visit: Payer: Self-pay | Admitting: Family

## 2021-06-07 DIAGNOSIS — Z139 Encounter for screening, unspecified: Secondary | ICD-10-CM

## 2022-02-10 ENCOUNTER — Other Ambulatory Visit: Payer: Self-pay | Admitting: Family

## 2022-02-10 DIAGNOSIS — F17210 Nicotine dependence, cigarettes, uncomplicated: Secondary | ICD-10-CM

## 2022-03-01 ENCOUNTER — Other Ambulatory Visit: Payer: Medicare Other

## 2022-04-18 ENCOUNTER — Other Ambulatory Visit: Payer: Medicare Other

## 2022-05-23 ENCOUNTER — Other Ambulatory Visit: Payer: Self-pay | Admitting: Family

## 2022-05-23 ENCOUNTER — Ambulatory Visit
Admission: RE | Admit: 2022-05-23 | Discharge: 2022-05-23 | Disposition: A | Discharge status: Home / Self Care | Payer: Medicare Other | Source: Ambulatory Visit | Attending: Family | Admitting: Family

## 2022-05-23 DIAGNOSIS — F17210 Nicotine dependence, cigarettes, uncomplicated: Secondary | ICD-10-CM

## 2022-05-23 DIAGNOSIS — M549 Dorsalgia, unspecified: Secondary | ICD-10-CM

## 2022-10-06 ENCOUNTER — Ambulatory Visit: Payer: Self-pay | Admitting: *Deleted

## 2022-10-06 NOTE — Telephone Encounter (Signed)
  Chief Complaint: buttock, groin, thigh, leg pain  Symptoms: hx back pain . Now having buttocks , groin and leg pain. Cramping in legs and calves. Possible not drinking enough per patient  can walk  Frequency: last Monday  Pertinent Negatives: Patient denies fever, no dark colored urine. Denies dizziness no lightheadedness. Can walk Disposition: [x] ED /[] Urgent Care (no appt availability in office) / [] Appointment(In office/virtual)/ []  [facility]/ [] Home Care/ [] Refused Recommended Disposition /[] [facility]/ []  Follow-up with PCP Additional Notes:   Recommended patient to contact PCP. Reports she has with no help. Recommended ED due to worsening pain after taking tylenol ES with no relief.    Reason for Disposition  [1] SEVERE pain (e.g., excruciating, unable to do any normal activities) AND [2] not improved 2 hours after pain medicine  Answer Assessment - Initial Assessment Questions 1. ONSET: "When did the muscle aches or body pains start?"      Buttocks , groin, legs  2. LOCATION: "What part of your body is hurting?" (e.g., entire body, arms, legs)      Buttocks , legs thighs groin. Cramps in calf of legs and fingers  3. SEVERITY: "How bad is the pain?" (Scale 1-10; or mild, moderate, severe)   - MILD (1-3): doesn't interfere with normal activities    - MODERATE (4-7): interferes with normal activities or awakens from sleep    - SEVERE (8-10):  excruciating pain, unable to do any normal activities      Able to walk and do normal activities  4. CAUSE: "What do you think is causing the pains?"     Not sure has been seen for back pain and given "shots" 5. FEVER: "Have you been having fever?"     no 6. OTHER SYMPTOMS: "Do you have any other symptoms?" (e.g., chest pain, weakness, rash, cold or flu symptoms, weight loss)     Back pain buttocks groin thigh leg pain  7. PREGNANCY: "Is there any chance you are pregnant?" "When was your last menstrual period?"      na 8. TRAVEL: "Have you traveled out of the country in the last month?" (e.g., travel history, exposures)     na  Protocols used: Muscle Aches and Body Pain-A-AH

## 2022-10-13 ENCOUNTER — Encounter (HOSPITAL_COMMUNITY): Payer: Self-pay

## 2022-10-13 ENCOUNTER — Ambulatory Visit (HOSPITAL_COMMUNITY)
Admission: EM | Admit: 2022-10-13 | Discharge: 2022-10-13 | Disposition: A | Discharge status: Home / Self Care | Payer: 59 | Attending: Emergency Medicine | Admitting: Emergency Medicine

## 2022-10-13 DIAGNOSIS — M5416 Radiculopathy, lumbar region: Secondary | ICD-10-CM | POA: Diagnosis not present

## 2022-10-13 MED ORDER — KETOROLAC TROMETHAMINE 30 MG/ML IJ SOLN
30.0000 mg | Freq: Once | INTRAMUSCULAR | Status: AC
  Administered 2022-10-13: 30 mg via INTRAMUSCULAR

## 2022-10-13 MED ORDER — METHYLPREDNISOLONE SODIUM SUCC 125 MG IJ SOLR
60.0000 mg | Freq: Once | INTRAMUSCULAR | Status: AC
  Administered 2022-10-13: 60 mg via INTRAMUSCULAR

## 2022-10-13 MED ORDER — KETOROLAC TROMETHAMINE 30 MG/ML IJ SOLN
INTRAMUSCULAR | Status: AC
  Filled 2022-10-13: qty 1

## 2022-10-13 MED ORDER — METHYLPREDNISOLONE SODIUM SUCC 125 MG IJ SOLR
INTRAMUSCULAR | Status: AC
  Filled 2022-10-13: qty 2

## 2022-10-13 NOTE — ED Triage Notes (Signed)
Pt reports leg pain and tailbone x 3 weeks. Pt states she had a fall a couple weeks ago. Pt states she has seen orthopedic provider and they gave her two injections.

## 2022-10-13 NOTE — ED Provider Notes (Signed)
[place]    CSN: [id number] Arrival date & time: 10/13/22  1019      History   Chief Complaint Chief Complaint  Patient presents with   Tailbone Pain    HPI Hannah Moon is a 67 y.o. female.   Patient has had ongoing right sided lower back pain that radiates down her right leg.  She has been to an orthopedic who is given her to intramuscular steroid injections, last one was at the beginning of March.  Orthopedics said she had a 'pinched spinal nerve' per patient.  Denies incontinence or saddle anesthesia.  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   Did have a fall 3 weeks ago and reports tailbone pain, has been using a heating pad.  Imaging from orthopedics was negative for fracture or dislocation per patient, records not available in [place].    The history is provided by the patient and medical records.    Past Medical History:  Diagnosis Date   Anxiety    Arthritis    Asthma    Depression    GERD (gastroesophageal reflux disease)    Hyperlipidemia    Hypertension    Sleep apnea     There are no problems to display for this patient.   Past Surgical History:  Procedure Laterality Date   LEG SURGERY      OB History   No obstetric history on file.      Home Medications    Prior to Admission medications   Medication Sig Start Date End Date Taking? Authorizing Provider  busPIRone (BUSPAR) 15 MG tablet Take 15 mg by mouth 2 (two) times daily.   Yes [provider]  hydrOXYzine (ATARAX/VISTARIL) 50 MG tablet Take 50 mg by mouth 3 (three) times daily as needed.   Yes [provider]  lisinopril (ZESTRIL) 10 MG tablet Take 10 mg by mouth daily.   Yes [provider]  meloxicam (MOBIC) 15 MG tablet Take 15 mg by mouth daily.   Yes [provider]  omeprazole (PRILOSEC) 20 MG capsule Take 20 mg by mouth daily.   Yes [provider]  simvastatin (ZOCOR) 40 MG tablet Take 40 mg  by mouth daily.   Yes [provider]    Family History Family History  Problem Relation Age of Onset   Colon cancer Neg Hx    Esophageal cancer Neg Hx    Rectal cancer Neg Hx    Stomach cancer Neg Hx     Social History Social History   Tobacco Use   Smoking status: Every Day    Packs/day: 0.50    Years: 40.00    Additional pack years: 0.00    Total pack years: 20.00    Types: Cigarettes   Smokeless tobacco: Never  Vaping Use   Vaping Use: Never used  Substance Use Topics   Alcohol use: Yes    Alcohol/week: 1.0 standard drink of alcohol    Types: 1 Cans of beer per week   Drug use: No     Allergies   Patient has no known allergies.   Review of Systems Review of Systems  Constitutional:  Negative for fever.  Cardiovascular:  Negative for chest pain.  Genitourinary:  Negative for difficulty urinating and dysuria.  Musculoskeletal:  Positive for arthralgias and back pain. Negative for gait problem.  Neurological:  Negative for syncope.     Physical Exam Triage Vital Signs ED Triage Vitals  Enc Vitals  Group     BP 10/13/22 1144 128/83     Pulse Rate 10/13/22 1144 92     Resp 10/13/22 1144 16     Temp 10/13/22 1144 98.3 F (36.8 C)     Temp Source 10/13/22 1144 Oral     SpO2 10/13/22 1144 100 %     Weight --      Height --      Head Circumference --      Peak Flow --      Pain Score 10/13/22 1148 6     Pain Loc --      Pain Edu? --      Excl. in [place]? --    No data found.  Updated Vital Signs BP 128/83 (BP Location: Left Arm)   Pulse 92   Temp 98.3 F (36.8 C) (Oral)   Resp 16   SpO2 100%   Visual Acuity Right Eye Distance:   Left Eye Distance:   Bilateral Distance:    Right Eye Near:   Left Eye Near:    Bilateral Near:     Physical Exam Vitals and nursing note reviewed.  Constitutional:      Appearance: Normal appearance.  HENT:     Head: Normocephalic and atraumatic.     Right Ear: External ear normal.     Left Ear:  External ear normal.  Cardiovascular:     Rate and Rhythm: Normal rate and regular rhythm.  Pulmonary:     Effort: Pulmonary effort is normal. No respiratory distress.  Musculoskeletal:        General: Tenderness and signs of injury present. No swelling or deformity. Normal range of motion.     Comments: Negative straight leg test bilaterally.  Reports increased pain with ambulation.  Skin:    General: Skin is warm.     Capillary Refill: Capillary refill takes less than 2 seconds.  Neurological:     Mental Status: She is alert and oriented to person, place, and time.  Psychiatric:        Behavior: Behavior is cooperative.      UC Treatments / Results  Labs (all labs ordered are listed, but only abnormal results are displayed) Labs Reviewed - No data to display  EKG   Radiology No results found.  Procedures Procedures (including critical care time)  Medications Ordered in UC Medications  ketorolac (TORADOL) 30 MG/ML injection 30 mg (30 mg Intramuscular Given 10/13/22 1220)  methylPREDNISolone sodium succinate (SOLU-MEDROL) 125 mg/2 mL injection 60 mg (60 mg Intramuscular Given 10/13/22 1220)    Initial Impression / Assessment and Plan / UC Course  I have reviewed the triage vital signs and the nursing notes.  Pertinent labs & imaging results that were available during my care of the patient were reviewed by me and considered in my medical decision making (see chart for details).  Vitals and triage reviewed, patient is hemodynamically stable.  Reports right-sided lower back pain that radiates down her leg and tailbone pain since fall 3 weeks ago, negative imaging since per patient, will withhold x-rays today.  Suspect lumbar radiculopathy.  Negative for saddle anesthesia, incontinence, low concern for emergent condition.  Will cover with steroid and single dose of Toradol.  Advised to follow-up with [facility] if symptoms persist.  Discussed gentle stretching and  topical Voltaren gel for pain relief, patient verbalized understanding, no questions at this time.    Final Clinical Impressions(s) / UC Diagnoses   Final diagnoses:  Lumbar  radiculopathy     Discharge Instructions      Your symptoms are consistent with a pinched nerve.  We have given you a dose of Toradol and Solu-Medrol in clinic.  Please take this weekend easy and refrain from yard or housework.  You can do gentle stretching.  You can apply a heating pad or topical Voltaren gel.  You can take Tylenol arthritis.  If your symptoms persist, you can follow-up with [facility] for further evaluation.  Please seek immediate evaluation if you develop inner leg numbness, incontinence, or worsening of pain or fever.      ED Prescriptions   None    I have reviewed the PDMP during this encounter.   Holland Kotter, [place], [place] 10/13/22 1416

## 2022-10-13 NOTE — Discharge Instructions (Addendum)
Your symptoms are consistent with a pinched nerve.  We have given you a dose of Toradol and Solu-Medrol in clinic.  Please take this weekend easy and refrain from yard or housework.  You can do gentle stretching.  You can apply a heating pad or topical Voltaren gel.  You can take Tylenol arthritis.  If your symptoms persist, you can follow-up with [facility] for further evaluation.  Please seek immediate evaluation if you develop inner leg numbness, incontinence, or worsening of pain or fever.

## 2023-02-07 ENCOUNTER — Other Ambulatory Visit: Payer: Self-pay | Admitting: Family

## 2023-02-07 DIAGNOSIS — Z1231 Encounter for screening mammogram for malignant neoplasm of breast: Secondary | ICD-10-CM

## 2023-02-22 ENCOUNTER — Ambulatory Visit
Admission: RE | Admit: 2023-02-22 | Discharge: 2023-02-22 | Disposition: A | Discharge status: Home / Self Care | Payer: 59 | Source: Ambulatory Visit | Attending: Family | Admitting: Family

## 2023-02-22 DIAGNOSIS — Z1231 Encounter for screening mammogram for malignant neoplasm of breast: Secondary | ICD-10-CM

## 2024-04-04 ENCOUNTER — Other Ambulatory Visit: Payer: Self-pay | Admitting: Adult Health Nurse Practitioner

## 2024-04-04 DIAGNOSIS — Z1231 Encounter for screening mammogram for malignant neoplasm of breast: Secondary | ICD-10-CM

## 2024-04-14 ENCOUNTER — Ambulatory Visit

## 2024-04-18 ENCOUNTER — Ambulatory Visit
Admission: RE | Admit: 2024-04-18 | Discharge: 2024-04-18 | Disposition: A | Discharge status: Home / Self Care | Source: Ambulatory Visit | Attending: Adult Health Nurse Practitioner | Admitting: Adult Health Nurse Practitioner

## 2024-04-18 DIAGNOSIS — Z1231 Encounter for screening mammogram for malignant neoplasm of breast: Secondary | ICD-10-CM

## 2024-06-17 ENCOUNTER — Other Ambulatory Visit: Payer: Self-pay | Admitting: Adult Health Nurse Practitioner

## 2024-06-17 DIAGNOSIS — F1020 Alcohol dependence, uncomplicated: Secondary | ICD-10-CM

## 2024-07-18 ENCOUNTER — Other Ambulatory Visit
